# Patient Record
Sex: Female | Born: 1965
Health system: Southern US, Community
[De-identification: ages and names within clinical notes are randomized; demographics above are authoritative.]

## PROBLEM LIST (undated history)

## (undated) DIAGNOSIS — D649 Anemia, unspecified: Secondary | ICD-10-CM

## (undated) HISTORY — DX: Anemia, unspecified: D64.9

---

## 1999-06-27 ENCOUNTER — Other Ambulatory Visit: Admission: RE | Admit: 1999-06-27 | Discharge: 1999-06-27 | Payer: Self-pay | Admitting: *Deleted

## 2000-06-27 ENCOUNTER — Other Ambulatory Visit: Admission: RE | Admit: 2000-06-27 | Discharge: 2000-06-27 | Payer: Self-pay | Admitting: *Deleted

## 2002-06-25 ENCOUNTER — Other Ambulatory Visit: Admission: RE | Admit: 2002-06-25 | Discharge: 2002-06-25 | Payer: Self-pay | Admitting: Obstetrics and Gynecology

## 2002-07-11 ENCOUNTER — Encounter: Payer: Self-pay | Admitting: Obstetrics and Gynecology

## 2002-07-11 ENCOUNTER — Encounter: Admission: RE | Admit: 2002-07-11 | Discharge: 2002-07-11 | Payer: Self-pay | Admitting: Obstetrics and Gynecology

## 2016-06-13 DIAGNOSIS — Z1231 Encounter for screening mammogram for malignant neoplasm of breast: Secondary | ICD-10-CM | POA: Diagnosis not present

## 2016-08-09 DIAGNOSIS — N920 Excessive and frequent menstruation with regular cycle: Secondary | ICD-10-CM | POA: Diagnosis not present

## 2016-08-09 DIAGNOSIS — R635 Abnormal weight gain: Secondary | ICD-10-CM | POA: Diagnosis not present

## 2016-08-09 DIAGNOSIS — Z01419 Encounter for gynecological examination (general) (routine) without abnormal findings: Secondary | ICD-10-CM | POA: Diagnosis not present

## 2016-08-09 DIAGNOSIS — D649 Anemia, unspecified: Secondary | ICD-10-CM

## 2016-08-09 HISTORY — DX: Anemia, unspecified: D64.9

## 2017-02-19 ENCOUNTER — Telehealth: Payer: Self-pay

## 2017-02-19 NOTE — Telephone Encounter (Signed)
02/19/17   Pre Visit call made to patient. Left message with  patients answering service for return call regarding pre-visit information. Patient is deaf.

## 2017-02-20 ENCOUNTER — Encounter: Payer: Self-pay | Admitting: Family

## 2017-02-20 ENCOUNTER — Ambulatory Visit (INDEPENDENT_AMBULATORY_CARE_PROVIDER_SITE_OTHER): Payer: BLUE CROSS/BLUE SHIELD | Admitting: Family

## 2017-02-20 ENCOUNTER — Ambulatory Visit (HOSPITAL_BASED_OUTPATIENT_CLINIC_OR_DEPARTMENT_OTHER)
Admission: RE | Admit: 2017-02-20 | Discharge: 2017-02-20 | Disposition: A | Discharge status: Home / Self Care | Payer: BLUE CROSS/BLUE SHIELD | Source: Ambulatory Visit | Attending: Family | Admitting: Family

## 2017-02-20 VITALS — BP 109/69 | HR 60 | Temp 98.3°F | Resp 16 | Ht 68.0 in | Wt 153.4 lb

## 2017-02-20 DIAGNOSIS — G8929 Other chronic pain: Secondary | ICD-10-CM

## 2017-02-20 DIAGNOSIS — M545 Low back pain, unspecified: Secondary | ICD-10-CM

## 2017-02-20 DIAGNOSIS — M47896 Other spondylosis, lumbar region: Secondary | ICD-10-CM | POA: Diagnosis not present

## 2017-02-20 DIAGNOSIS — R635 Abnormal weight gain: Secondary | ICD-10-CM

## 2017-02-20 DIAGNOSIS — D649 Anemia, unspecified: Secondary | ICD-10-CM

## 2017-02-20 DIAGNOSIS — R8290 Unspecified abnormal findings in urine: Secondary | ICD-10-CM

## 2017-02-20 DIAGNOSIS — L750 Bromhidrosis: Secondary | ICD-10-CM

## 2017-02-20 LAB — CBC WITH DIFFERENTIAL/PLATELET
Basophils Absolute: 0 10*3/uL (ref 0.0–0.1)
Basophils Relative: 0.7 % (ref 0.0–3.0)
Eosinophils Absolute: 0.1 10*3/uL (ref 0.0–0.7)
Eosinophils Relative: 1.2 % (ref 0.0–5.0)
HCT: 38.5 % (ref 36.0–46.0)
Hemoglobin: 12.9 g/dL (ref 12.0–15.0)
Lymphocytes Relative: 27.8 % (ref 12.0–46.0)
Lymphs Abs: 1.6 10*3/uL (ref 0.7–4.0)
MCHC: 33.4 g/dL (ref 30.0–36.0)
MCV: 82.6 fl (ref 78.0–100.0)
Monocytes Absolute: 0.4 10*3/uL (ref 0.1–1.0)
Monocytes Relative: 7.3 % (ref 3.0–12.0)
Neutro Abs: 3.6 10*3/uL (ref 1.4–7.7)
Neutrophils Relative %: 63 % (ref 43.0–77.0)
Platelets: 226 10*3/uL (ref 150.0–400.0)
RBC: 4.66 Mil/uL (ref 3.87–5.11)
RDW: 12.9 % (ref 11.5–15.5)
WBC: 5.7 10*3/uL (ref 4.0–10.5)

## 2017-02-20 LAB — POC URINALSYSI DIPSTICK (AUTOMATED)
Bilirubin, UA: NEGATIVE
Blood, UA: NEGATIVE
Glucose, UA: NEGATIVE
Ketones, UA: NEGATIVE
Leukocytes, UA: NEGATIVE
Nitrite, UA: NEGATIVE
Protein, UA: NEGATIVE
Spec Grav, UA: 1.02 (ref 1.010–1.025)
Urobilinogen, UA: NEGATIVE E.U./dL — AB
pH, UA: 6 (ref 5.0–8.0)

## 2017-02-20 LAB — FERRITIN: Ferritin: 10.5 ng/mL (ref 10.0–291.0)

## 2017-02-20 LAB — IRON: Iron: 90 ug/dL (ref 42–145)

## 2017-02-20 LAB — TSH: TSH: 1.15 u[IU]/mL (ref 0.35–4.50)

## 2017-02-20 NOTE — Patient Instructions (Addendum)
Please complete lab work prior to leaving.  Welcome to [place]! 

## 2017-02-20 NOTE — Addendum Note (Signed)
Addended by: Mervin KungFERGERSON, Jozey Janco A on: 02/20/2017 01:45 PM   Modules accepted: Orders

## 2017-02-20 NOTE — Progress Notes (Signed)
Subjective:    Patient ID: Karla Jackson, female    DOB: 1965-11-02, 51 y.o.   MRN: [id number]  HPI  Karla Jackson is a 51 yr old female who presents today to establish care. She has several concerns:  1) Urinary odor- notes that urine has been cloudy recently with strong odor. Started when she began iron. Denies dysuria or frequency  2) Weight gain- reports that she had been following a high protein diet and gained 25 pounds after stopping her diet.  This has occurred over 4 years. Had gotten down to 130 pounds.  Reports weight has leveled off.   Wt Readings from Last 3 Encounters:  02/20/17 153 lb 6.4 oz (69.6 kg)   3) Right sided low back pain- not debilitating.  Yoga helps.  Non-radiating. No numbness/weakness.  Using advil prn which helps. Declines further work up at this time.   pmhx is significant for anemia. Saw OB/GYN back in November, had some fatigue.  She reports that she had constipation. She follows with [facility]    Review of Systems  Constitutional: Negative for unexpected weight change.  HENT: Negative for hearing loss and rhinorrhea.   Eyes: Negative for visual disturbance.  Respiratory: Negative for cough.   Cardiovascular: Negative for leg swelling.  Gastrointestinal:       Some constipation when on high protein diet  Genitourinary: Negative for dysuria and frequency.  Musculoskeletal: Positive for back pain.  Neurological:       Mild Ha's during menstes  Hematological: Negative for adenopathy.  Psychiatric/Behavioral:       Denies depression/anxiety   Past Medical History:  Diagnosis Date  . Anemia      Social History   Social History  . Marital status: Married    Spouse name: N/A  . Number of children: N/A  . Years of education: N/A   Occupational History  . Not on file.   Social History Main Topics  . Smoking status: Never Smoker  . Smokeless tobacco: Never Used  . Alcohol use No  . Drug use: No  . Sexual activity: Not on file    Other Topics Concern  . Not on file   Social History Narrative   3 sons- [id number] and 11998 (twins)    Married   Works as an Environmental health practitioneradministrative assistant- [employer]   Enjoys yoga, walking the dog, listening to books on audible, mother has alzheimers    Enjoys outdoor activities     Past Surgical History:  Procedure Laterality Date  . CESAREAN SECTION  1998    Family History  Problem Relation Age of Onset  . Alzheimer's disease Mother   . Arthritis Mother   . Heart disease Father   . Hypertension Father   . Stroke Maternal Grandmother     No Known Allergies  No current outpatient prescriptions on file prior to visit.   No current facility-administered medications on file prior to visit.     BP 109/69 (BP Location: Left Arm, Cuff Size: Normal)   Pulse 60   Temp 98.3 F (36.8 C) (Oral)   Resp 16   Ht 5\' 8"  (1.727 m)   Wt 153 lb 6.4 oz (69.6 kg)   LMP 01/26/2017   SpO2 100%   BMI 23.32 kg/m       Objective:   Physical Exam  Constitutional: She is oriented to person, place, and time. She appears well-developed and well-nourished.  HENT:  Head: Normocephalic and atraumatic.  Right Ear:  Tympanic membrane and ear canal normal.  Left Ear: Tympanic membrane and ear canal normal.  Eyes: No scleral icterus.  Neck: Neck supple. No thyromegaly present.  Cardiovascular: Normal rate, regular rhythm and normal heart sounds.   No murmur heard. Pulmonary/Chest: Effort normal and breath sounds normal. No respiratory distress. She has no wheezes.  Musculoskeletal: She exhibits no edema.  Neurological: She is alert and oriented to person, place, and time.  Skin: Skin is warm and dry.  Psychiatric: She has a normal mood and affect. Her behavior is normal. Judgment and thought content normal.          Assessment & Plan:  Weight gain- will check TSH. Discussed that her weight is currently healthy. Recommended that she focus on avoiding further weight gain and  maintaining weight.   Urinary odor- UA unremarkable. Will send urine for culture.   Low back pain- mild, likely musculoskeletal. She would like to image her back, will obtain plain film of lumbar spine.   Anemia- not currently on iron supplement. Check CBC and iron.

## 2017-02-21 ENCOUNTER — Ambulatory Visit: Payer: Self-pay | Admitting: Family

## 2017-02-23 ENCOUNTER — Encounter: Payer: Self-pay | Admitting: Family

## 2017-02-23 ENCOUNTER — Telehealth: Payer: Self-pay | Admitting: Family

## 2017-02-23 LAB — URINE CULTURE

## 2017-02-23 MED ORDER — NITROFURANTOIN MONOHYD MACRO 100 MG PO CAPS: 100.0000 mg | ORAL_CAPSULE | Freq: Two times a day (BID) | ORAL | 0 refills | Status: DC

## 2017-02-23 NOTE — Telephone Encounter (Signed)
Notified pt and she voices understanding. 

## 2017-02-23 NOTE — Telephone Encounter (Signed)
Urine culture is growing bacteria. I would like her to start macrobid. Call if urinary odor does not improve after completion of macrobid.

## 2017-03-19 ENCOUNTER — Encounter: Payer: Self-pay | Admitting: Family

## 2017-03-19 ENCOUNTER — Ambulatory Visit (INDEPENDENT_AMBULATORY_CARE_PROVIDER_SITE_OTHER): Payer: BLUE CROSS/BLUE SHIELD | Admitting: Family

## 2017-03-19 VITALS — BP 102/78 | HR 77 | Temp 98.1°F | Resp 16 | Ht 68.0 in | Wt 149.8 lb

## 2017-03-19 DIAGNOSIS — R9431 Abnormal electrocardiogram [ECG] [EKG]: Secondary | ICD-10-CM

## 2017-03-19 DIAGNOSIS — Z0001 Encounter for general adult medical examination with abnormal findings: Secondary | ICD-10-CM

## 2017-03-19 DIAGNOSIS — Z0000 Encounter for general adult medical examination without abnormal findings: Secondary | ICD-10-CM

## 2017-03-19 DIAGNOSIS — Z23 Encounter for immunization: Secondary | ICD-10-CM

## 2017-03-19 LAB — LIPID PANEL
Cholesterol: 170 mg/dL (ref 0–200)
HDL: 59.8 mg/dL (ref >39.00)
LDL Cholesterol: 96 mg/dL (ref 0–99)
NONHDL: 110.26
TRIGLYCERIDES: 71 mg/dL (ref 0.0–149.0)
Total CHOL/HDL Ratio: 3
VLDL: 14.2 mg/dL (ref 0.0–40.0)

## 2017-03-19 LAB — HEPATIC FUNCTION PANEL
ALK PHOS: 52 U/L (ref 39–117)
ALT: 12 U/L (ref 0–35)
AST: 17 U/L (ref 0–37)
Albumin: 4.4 g/dL (ref 3.5–5.2)
Bilirubin, Direct: 0.1 mg/dL (ref 0.0–0.3)
Total Bilirubin: 0.6 mg/dL (ref 0.2–1.2)
Total Protein: 7.1 g/dL (ref 6.0–8.3)

## 2017-03-19 LAB — BASIC METABOLIC PANEL
BUN: 19 mg/dL (ref 6–23)
CALCIUM: 9.7 mg/dL (ref 8.4–10.5)
CO2: 27 mEq/L (ref 19–32)
CREATININE: 0.91 mg/dL (ref 0.40–1.20)
Chloride: 104 mEq/L (ref 96–112)
GFR: 69.3 mL/min (ref >60.00)
Glucose, Bld: 106 mg/dL — ABNORMAL HIGH (ref 70–99)
Potassium: 4.8 mEq/L (ref 3.5–5.1)
SODIUM: 137 meq/L (ref 135–145)

## 2017-03-19 NOTE — Telephone Encounter (Signed)
Was attempting to add tdap to [facility] and found potential match with pt. Emailed pt to verify past address and last name. Awaiting response.

## 2017-03-19 NOTE — Addendum Note (Signed)
Addended by: Mervin KungFERGERSON, Jermany Rimel A on: 03/19/2017 06:57 PM   Modules accepted: Orders

## 2017-03-19 NOTE — Patient Instructions (Signed)
Please continue healthy diet, regular exercise. Schedule follow up with your GYN for pap smear.  You will be contacted about your referral for exercise stress test.

## 2017-03-19 NOTE — Progress Notes (Signed)
Subjective:    Patient ID: Karla Jackson, female    DOB: 05-08-1966, 51 y.o.   MRN: [id number]  HPI  Patient presents today for complete physical.  Immunizations: Due for tetanus Diet: healthy Exercise: walks/runs/yoga Colonoscopy: due Pap Smear: due, sees GYN Mammogram: 9/18  Wt Readings from Last 3 Encounters:  03/19/17 149 lb 12.8 oz (67.9 kg)  02/20/17 153 lb 6.4 oz (69.6 kg)       Review of Systems  Constitutional: Negative for unexpected weight change.  HENT: Negative for hearing loss and rhinorrhea.   Eyes: Negative for visual disturbance.  Respiratory: Negative for cough.        Reports that she gets "more short of breath than I think I should some times with exercise."  Cardiovascular: Negative for leg swelling.  Gastrointestinal: Negative for constipation and diarrhea.  Genitourinary: Negative for dysuria, frequency and menstrual problem.  Musculoskeletal: Negative for arthralgias and myalgias.  Neurological: Negative for headaches.  Hematological: Negative for adenopathy.  Psychiatric/Behavioral:       Denies depression/anxiety       Past Medical History:  Diagnosis Date  . Anemia      Social History   Social History  . Marital status: Married    Spouse name: N/A  . Number of children: N/A  . Years of education: N/A   Occupational History  . Not on file.   Social History Main Topics  . Smoking status: Never Smoker  . Smokeless tobacco: Never Used  . Alcohol use No  . Drug use: No  . Sexual activity: Not on file   Other Topics Concern  . Not on file   Social History Narrative   3 sons- 95 and 61 (twins)    Married   Works as an Environmental health practitioner- [employer]   Enjoys yoga, walking the dog, listening to books on audible, mother has alzheimers    Enjoys outdoor activities     Past Surgical History:  Procedure Laterality Date  . CESAREAN SECTION  1998    Family History  Problem Relation Age of Onset    . Alzheimer's disease Mother   . Arthritis Mother   . Heart disease Father        Valve replacement surgery, died at 71  . Hypertension Father   . Stroke Maternal Grandmother     No Known Allergies  No current outpatient prescriptions on file prior to visit.   No current facility-administered medications on file prior to visit.     BP 102/78 (BP Location: Right Arm, Cuff Size: Normal)   Pulse 77   Temp 98.1 F (36.7 C) (Oral)   Resp 16   Ht 5\' 8"  (1.727 m)   Wt 149 lb 12.8 oz (67.9 kg)   LMP 02/28/2017   SpO2 99%   BMI 22.78 kg/m    Objective:   Physical Exam  Physical Exam  Constitutional: She is oriented to person, place, and time. She appears well-developed and well-nourished. No distress.  HENT:  Head: Normocephalic and atraumatic.  Right Ear: Tympanic membrane and ear canal normal.  Left Ear: Tympanic membrane and ear canal normal.  Mouth/Throat: Oropharynx is clear and moist.  Eyes: Pupils are equal, round, and reactive to light. No scleral icterus.  Neck: Normal range of motion. No thyromegaly present.  Cardiovascular: Normal rate and regular rhythm.   No murmur heard. Pulmonary/Chest: Effort normal and breath sounds normal. No respiratory distress. He has no wheezes. She has no rales. She  exhibits no tenderness.  Abdominal: Soft. Bowel sounds are normal. She exhibits no distension and no mass. There is no tenderness. There is no rebound and no guarding.  Musculoskeletal: She exhibits no edema.  Lymphadenopathy:    She has no cervical adenopathy.  Neurological: She is alert and oriented to person, place, and time. She has normal patellar reflexes. She exhibits normal muscle tone. Coordination normal.  Skin: Skin is warm and dry.  Psychiatric: She has a normal mood and affect. Her behavior is normal. Judgment and thought content normal.  Breasts: Examined lying Right: Without masses, retractions, discharge or axillary adenopathy.  Left: Without masses,  retractions, discharge or axillary adenopathy.  Pelvic: deferred       Assessment & Plan:         Assessment & Plan:  Preventative care- discussed continuing healthy diet, exercise. Tdap today. Obtain routine lab work. Advised pt to arrange follow up with her GYN for pap. Mammo up to date. Refer for colo. I have also asked her to check with her insurance re: coverage for shingrix then schedule nurse visit.   Abnormal EKG- note is made of TWI in V2. She reports that she has seen cardiology in the past. Had EKG with Dr. Thomes LollingFulk, cardiology. Will request these records and will also refer for an ETT due to report of feeling short of breath at times with exercise.

## 2017-03-20 ENCOUNTER — Encounter: Payer: BLUE CROSS/BLUE SHIELD | Admitting: Family

## 2017-03-20 NOTE — Telephone Encounter (Signed)
[facility] updated. 

## 2017-03-28 ENCOUNTER — Telehealth: Payer: Self-pay | Admitting: *Deleted

## 2017-03-29 NOTE — Telephone Encounter (Signed)
Pt signed records release to [facility] at last [facility] on 03/19/17. Request faxed to [phone]. Awaiting records.

## 2017-04-03 ENCOUNTER — Encounter: Payer: Self-pay | Admitting: *Deleted

## 2017-04-03 ENCOUNTER — Encounter: Payer: Self-pay | Admitting: Gastroenterology

## 2017-04-03 NOTE — Telephone Encounter (Signed)
Scheduled stress test for 04/09/17 at 10am. Pt to arrive 10 min early and wear comfortable / workout clothes. [address] address and phone # given to pt. Records release faxed to below #.

## 2017-04-03 NOTE — Telephone Encounter (Signed)
I did not refer to cardiology but did place a referral for an Exercise stress test. Can  You please check status of scheduling of the stress test?

## 2017-04-03 NOTE — Telephone Encounter (Signed)
Received fax from Dr Timoteo GaulFulk's office that they are not able to find records for this patient in their system. Attempted to reach pt and verify if request was sent to correct doctor / location and left detailed message to call back and confirm cardiology contact.

## 2017-04-03 NOTE — Telephone Encounter (Signed)
Pt called me back stating her records stayed with [facility] and we can fax records release to Attn: Consuella LoseElaine at [phone]. Pt also states she has not been contacted by cardiology for an appt yet. Were we waiting to get previous records before placing referral?

## 2017-04-05 ENCOUNTER — Telehealth: Payer: Self-pay | Admitting: *Deleted

## 2017-04-05 NOTE — Telephone Encounter (Signed)
Received [facility], Dr. Thomes L[facility]lingFulk, forwarded to provider/SLS 06/28

## 2017-04-10 ENCOUNTER — Ambulatory Visit (INDEPENDENT_AMBULATORY_CARE_PROVIDER_SITE_OTHER): Payer: BLUE CROSS/BLUE SHIELD

## 2017-04-10 ENCOUNTER — Ambulatory Visit (AMBULATORY_SURGERY_CENTER): Payer: Self-pay

## 2017-04-10 VITALS — Ht 68.0 in | Wt 152.0 lb

## 2017-04-10 DIAGNOSIS — R9431 Abnormal electrocardiogram [ECG] [EKG]: Secondary | ICD-10-CM | POA: Diagnosis not present

## 2017-04-10 DIAGNOSIS — Z1211 Encounter for screening for malignant neoplasm of colon: Secondary | ICD-10-CM

## 2017-04-10 LAB — EXERCISE TOLERANCE TEST
CHL CUP RESTING HR STRESS: 62 {beats}/min
CHL RATE OF PERCEIVED EXERTION: 15
CSEPEDS: 30 s
CSEPEW: 12.8 METS
Exercise duration (min): 10 min
MPHR: 170 {beats}/min
Peak HR: 151 {beats}/min
Percent HR: 88 %

## 2017-04-10 MED ORDER — NA SULFATE-K SULFATE-MG SULF 17.5-3.13-1.6 GM/177ML PO SOLN: ORAL | 0 refills | Status: DC

## 2017-04-10 NOTE — Progress Notes (Signed)
Per pt, no allergies to soy or egg products.Pt not taking any weight loss meds or using  O2 at home.  Emmi video sent to pt's email. 

## 2017-04-12 ENCOUNTER — Encounter: Payer: Self-pay | Admitting: Family

## 2017-04-13 ENCOUNTER — Encounter: Payer: Self-pay | Admitting: Gastroenterology

## 2017-04-20 ENCOUNTER — Encounter: Payer: Self-pay | Admitting: Gastroenterology

## 2017-04-20 ENCOUNTER — Ambulatory Visit (AMBULATORY_SURGERY_CENTER): Payer: BLUE CROSS/BLUE SHIELD | Admitting: Gastroenterology

## 2017-04-20 VITALS — BP 108/73 | HR 69 | Temp 98.7°F | Resp 11 | Ht 68.0 in | Wt 152.0 lb

## 2017-04-20 DIAGNOSIS — Z1211 Encounter for screening for malignant neoplasm of colon: Secondary | ICD-10-CM

## 2017-04-20 DIAGNOSIS — Z1212 Encounter for screening for malignant neoplasm of rectum: Secondary | ICD-10-CM

## 2017-04-20 MED ORDER — SODIUM CHLORIDE 0.9 % IV SOLN: 500.0000 mL | INTRAVENOUS | Status: AC

## 2017-04-20 NOTE — Op Note (Signed)
[facility] Patient Name: Karla Jackson Procedure Date: 04/20/2017 2:24 PM MRN: [id number] Endoscopist: Napoleon Form , MD Age: 51 Referring MD:  Date of Birth: 08-27-1966 Gender: Female Account #: [id number] Procedure:                Colonoscopy Indications:              Screening for colorectal malignant neoplasm Medicines:                Monitored Anesthesia Care Procedure:                Pre-Anesthesia Assessment:                           - Prior to the procedure, a History and Physical                            was performed, and patient medications and                            allergies were reviewed. The patient's tolerance of                            previous anesthesia was also reviewed. The risks                            and benefits of the procedure and the sedation                            options and risks were discussed with the patient.                            All questions were answered, and informed consent                            was obtained. Prior Anticoagulants: The patient has                            taken no previous anticoagulant or antiplatelet                            agents. ASA Grade Assessment: II - A patient with                            mild systemic disease. After reviewing the risks                            and benefits, the patient was deemed in                            satisfactory condition to undergo the procedure.                           After obtaining informed consent, the colonoscope  was passed under direct vision. Throughout the                            procedure, the patient's blood pressure, pulse, and                            oxygen saturations were monitored continuously. The                            Model PCF-[id number]) scope was introduced                            through the anus and advanced to the the terminal                            ileum,  with identification of the appendiceal                            orifice and IC valve. The colonoscopy was performed                            without difficulty. The patient tolerated the                            procedure well. The quality of the bowel                            preparation was excellent. The terminal ileum,                            ileocecal valve, appendiceal orifice, and rectum                            were photographed. Scope In: 2:34:47 PM Scope Out: 2:49:13 PM Scope Withdrawal Time: 0 hours 7 minutes 25 seconds  Total Procedure Duration: 0 hours 14 minutes 26 seconds  Findings:                 The perianal and digital rectal examinations were                            normal.                           Non-bleeding internal hemorrhoids were found during                            retroflexion. The hemorrhoids were small.                           The exam was otherwise without abnormality. Complications:            No immediate complications. Estimated Blood Loss:     Estimated blood loss: none. Impression:               - Non-bleeding internal hemorrhoids.                           -  The examination was otherwise normal.                           - No specimens collected. Recommendation:           - Patient has a contact number available for                            emergencies. The signs and symptoms of potential                            delayed complications were discussed with the                            patient. Return to normal activities tomorrow.                            Written discharge instructions were provided to the                            patient.                           - Resume previous diet.                           - Continue present medications.                           - Await pathology results.                           - Repeat colonoscopy in 10 years for screening                            purposes. Napoleon Form, MD 04/20/2017 2:53:42 PM This report has been signed electronically.

## 2017-04-20 NOTE — Progress Notes (Signed)
Alert and oriented x3, pleased with MAC, report to RN Judson Roch

## 2017-04-20 NOTE — Patient Instructions (Signed)
YOU HAD AN ENDOSCOPIC PROCEDURE TODAY AT THE [facility]:   Refer to the procedure report that was given to you for any specific questions about what was found during the examination.  If the procedure report does not answer your questions, please call your gastroenterologist to clarify.  If you requested that your care partner not be given the details of your procedure findings, then the procedure report has been included in a sealed envelope for you to review at your convenience later.  YOU SHOULD EXPECT: Some feelings of bloating in the abdomen. Passage of more gas than usual.  Walking can help get rid of the air that was put into your GI tract during the procedure and reduce the bloating. If you had a lower endoscopy (such as a colonoscopy or flexible sigmoidoscopy) you may notice spotting of blood in your stool or on the toilet paper. If you underwent a bowel prep for your procedure, you may not have a normal bowel movement for a few days.  Please Note:  You might notice some irritation and congestion in your nose or some drainage.  This is from the oxygen used during your procedure.  There is no need for concern and it should clear up in a day or so.  SYMPTOMS TO REPORT IMMEDIATELY:   Following lower endoscopy (colonoscopy or flexible sigmoidoscopy):  Excessive amounts of blood in the stool  Significant tenderness or worsening of abdominal pains  Swelling of the abdomen that is new, acute  Fever of 100F or higher  For urgent or emergent issues, a gastroenterologist can be reached at any hour by calling [phone].   DIET:  We do recommend a small meal at first, but then you may proceed to your regular diet.  Drink plenty of fluids but you should avoid alcoholic beverages for 24 hours.  MEDICATIONS:  Continue present medications.  ACTIVITY:  You should plan to take it easy for the rest of today and you should NOT DRIVE or use heavy machinery until tomorrow (because of the  sedation medicines used during the test).    FOLLOW UP: Our staff will call the number listed on your records the next business day following your procedure to check on you and address any questions or concerns that you may have regarding the information given to you following your procedure. If we do not reach you, we will leave a message.  However, if you are feeling well and you are not experiencing any problems, there is no need to return our call.  We will assume that you have returned to your regular daily activities without incident.  If any biopsies were taken you will be contacted by phone or by letter within the next 1-3 weeks.  Please call us at [phone] if you have not heard about the biopsies in 3 weeks.   Thank you for allowing us to provide for your healthcare needs today.   SIGNATURES/CONFIDENTIALITY: You and/or your care partner have signed paperwork which will be entered into your electronic medical record.  These signatures attest to the fact that that the information above on your After Visit Summary has been reviewed and is understood.  Full responsibility of the confidentiality of this discharge information lies with you and/or your care-partner. 

## 2017-04-20 NOTE — Progress Notes (Signed)
Pt's states no medical or surgical changes since previsit or office visit. 

## 2017-04-23 ENCOUNTER — Telehealth: Payer: Self-pay | Admitting: *Deleted

## 2017-04-23 ENCOUNTER — Telehealth: Payer: Self-pay

## 2017-04-23 NOTE — Telephone Encounter (Signed)
No answer. Message left to call if questions or concerns. 

## 2017-04-23 NOTE — Telephone Encounter (Signed)
  Follow up Call-  Call back number 04/20/2017  Post procedure Call Back phone  # [phone]  Permission to leave phone message Yes  Some recent data might be hidden     No answer, left message

## 2017-06-22 DIAGNOSIS — M546 Pain in thoracic spine: Secondary | ICD-10-CM | POA: Diagnosis not present

## 2017-06-26 DIAGNOSIS — S46811D Strain of other muscles, fascia and tendons at shoulder and upper arm level, right arm, subsequent encounter: Secondary | ICD-10-CM | POA: Diagnosis not present

## 2017-07-13 DIAGNOSIS — Z1231 Encounter for screening mammogram for malignant neoplasm of breast: Secondary | ICD-10-CM | POA: Diagnosis not present

## 2017-07-25 ENCOUNTER — Encounter: Payer: Self-pay | Admitting: Family

## 2017-07-25 ENCOUNTER — Ambulatory Visit (INDEPENDENT_AMBULATORY_CARE_PROVIDER_SITE_OTHER): Payer: BLUE CROSS/BLUE SHIELD | Admitting: Family

## 2017-07-25 VITALS — BP 120/73 | HR 66 | Temp 97.9°F | Resp 18 | Ht 68.0 in | Wt 154.0 lb

## 2017-07-25 DIAGNOSIS — M5412 Radiculopathy, cervical region: Secondary | ICD-10-CM | POA: Diagnosis not present

## 2017-07-25 DIAGNOSIS — B354 Tinea corporis: Secondary | ICD-10-CM | POA: Diagnosis not present

## 2017-07-25 MED ORDER — MELOXICAM 7.5 MG PO TABS: 7.5000 mg | ORAL_TABLET | Freq: Every day | ORAL | 0 refills | Status: DC

## 2017-07-25 NOTE — Progress Notes (Signed)
Subjective:    Patient ID: Karla Jackson, female    DOB: May 17, 1966, 51 y.o.   MRN: [id number]  HPI  Karla Jackson is a 51 yr old female who presents today with chief complaint of back pain. Reports that back pain is located in the upper back. Also has some right shoulder pain.  Pain started around 10/1 and she was seen by urgent care. They treated her with advil and muscle relaxers.    9/8- had migraine. Had 3 "fever blisters" inside her lower lip.  She developed back pain the following Tuesday.  Friday she had severe pain between her scapula and which radiated down the right arm.  She went to a walk in clinic.  PA thought that her pain was a muscle spasm. Was given IM NSAID and muscle relaxer.  Felt some better the next day. Then on Monday she went to work and pain was terrible.  Returned Tuesday AM and was given a steroid injection, oral taper, muscle relaxer.  Went to the [facility].  She had a massage on Thursday. She reports numbness in the right index finger. Sitting worsens pain.   Skin problem- reports white spots on arms/upper abdomen.  Review of Systems See HPI  Past Medical History:  Diagnosis Date  . Anemia 08/2016     Social History   Social History  . Marital status: Married    Spouse name: N/A  . Number of children: N/A  . Years of education: N/A   Occupational History  . Not on file.   Social History Main Topics  . Smoking status: Never Smoker  . Smokeless tobacco: Never Used  . Alcohol use No  . Drug use: No  . Sexual activity: Not on file   Other Topics Concern  . Not on file   Social History Narrative   3 sons- 42 and 25 (twins)    Married   Works as an Environmental health practitioner- [employer]   Enjoys yoga, walking the dog, listening to books on audible, mother has alzheimers    Enjoys outdoor activities     Past Surgical History:  Procedure Laterality Date  . CESAREAN SECTION  1998   1 time    Family History  Problem  Relation Age of Onset  . Alzheimer's disease Mother   . Arthritis Mother   . Scoliosis Mother   . Heart disease Father        Valve replacement surgery, died at 21  . Hypertension Father   . Stroke Maternal Grandmother   . Scoliosis Sister   . Colon cancer Neg Hx   . Esophageal cancer Neg Hx   . Stomach cancer Neg Hx     Allergies  Allergen Reactions  . Ampicillin     rash  . Erythromycin     rash    Current Outpatient Prescriptions on File Prior to Visit  Medication Sig Dispense Refill  . acetaminophen (TYLENOL) 500 MG tablet Take 500 mg by mouth every 6 (six) hours as needed.    Marland Kitchen ibuprofen (ADVIL,MOTRIN) 200 MG tablet Take 200 mg by mouth every 6 (six) hours as needed.     Current Facility-Administered Medications on File Prior to Visit  Medication Dose Route Frequency Provider Last Rate Last Dose  . 0.9 %  sodium chloride infusion  500 mL Intravenous Continuous Nandigam, Kavitha V, MD        BP 120/73 (BP Location: Left Arm, Cuff Size: Normal)   Pulse 66  Temp 97.9 F (36.6 C) (Oral)   Resp 18   Ht 5\' 8"  (1.727 m)   Wt 154 lb (69.9 kg)   LMP 07/13/2017   SpO2 99%   BMI 23.42 kg/m       Objective:   Physical Exam  Constitutional: She is oriented to person, place, and time. She appears well-developed and well-nourished.  Cardiovascular: Normal rate, regular rhythm and normal heart sounds.   No murmur heard. Pulmonary/Chest: Effort normal and breath sounds normal. No respiratory distress. She has no wheezes.  Musculoskeletal: She exhibits no edema.  Neurological: She is alert and oriented to person, place, and time.  Bilateral upper extremity strength is 5 out of 5  Skin: Skin is warm and dry.  She has some raised annular lesions noted on her chest. Shoulders are noted to have hypopigmentation most consistent with sun damage  Psychiatric: She has a normal mood and affect. Her behavior is normal. Judgment and thought content normal.          Assessment  & Plan:  Cervical pain with radiculopathy-patient with radiculopathy in C7 distribution on the right.  She has failed treatment with steroids as well as  anti-inflammatories. Conservative measures. In the meantime I've given her a prescription for meloxicam and as needed Flexeril.  Tinea corporis-will give trial of otc lotrimin cream.  Apply bid and call if rash worsens or fails to improve in next few weeks.

## 2017-07-25 NOTE — Patient Instructions (Addendum)
Begin lotrimin cream twice daily to rash for next 2 weeks or until resolved. Will order MRI of the cervical spine to further evaluate given duration and lack of response to Call if symptoms worsen or fail to improve. For neck/arm pain- please begin meloxicam once daily.  You may use flexeril as needed but this may cause drowsiness so don't drive after taking. You will be contacted about scheduling your MRI once we get approval from your insurance.

## 2017-08-03 ENCOUNTER — Encounter: Payer: Self-pay | Admitting: Family

## 2017-08-03 ENCOUNTER — Other Ambulatory Visit: Payer: Self-pay | Admitting: Family

## 2017-08-03 MED ORDER — FLUCONAZOLE 150 MG PO TABS: ORAL_TABLET | ORAL | 0 refills | Status: DC

## 2017-08-03 NOTE — Telephone Encounter (Signed)
Could you [facility] at [facility]?

## 2017-08-03 NOTE — Progress Notes (Signed)
di

## 2017-08-13 DIAGNOSIS — M50223 Other cervical disc displacement at C6-C7 level: Secondary | ICD-10-CM | POA: Diagnosis not present

## 2017-08-13 DIAGNOSIS — M4802 Spinal stenosis, cervical region: Secondary | ICD-10-CM | POA: Diagnosis not present

## 2017-08-22 ENCOUNTER — Encounter: Payer: Self-pay | Admitting: Family

## 2017-08-22 NOTE — Telephone Encounter (Signed)
Could you please contact [facility] and request MRI C spine report?

## 2017-08-24 NOTE — Telephone Encounter (Signed)
Spoke with Karla BradfordKimberly at [facility], [phone] and requested report be faxed to nurse station. Awaiting result.

## 2017-08-24 NOTE — Telephone Encounter (Signed)
Results received and placed in PCP yellow folder for review. Please advise.

## 2017-08-26 NOTE — Progress Notes (Deleted)
Subjective:    Patient ID: Karla Jackson, female    DOB: 1966/09/04, 51 y.o.   MRN: [id number]  HPI   Pt is a 51 yr old female who presents today for follow up.   Tinea corporis- last visit we rx'd with lotrimin cream.  This did not help her rash. We then rx'd oral diflucan.   Cervical neck pain with radiculopathy- pt was treated with flexeril and meloxicam. She completed an MRI.  MRI noted 3 mm C5-6 disc protrusionwhich deforms the ventral spinal cortd resulting in mild canal stenosis.  Degenerative changes of the cpain noted with severe Right C3-4 facet arthropathy and mild left C6-7 neural foraminal narrowing.     Review of Systems See HPI  Past Medical History:  Diagnosis Date  . Anemia 08/2016     Social History   Socioeconomic History  . Marital status: Married    Spouse name: Not on file  . Number of children: Not on file  . Years of education: Not on file  . Highest education level: Not on file  Social Needs  . Financial resource strain: Not on file  . Food insecurity - worry: Not on file  . Food insecurity - inability: Not on file  . Transportation needs - medical: Not on file  . Transportation needs - non-medical: Not on file  Occupational History  . Not on file  Tobacco Use  . Smoking status: Never Smoker  . Smokeless tobacco: Never Used  Substance and Sexual Activity  . Alcohol use: No  . Drug use: No  . Sexual activity: Not on file  Other Topics Concern  . Not on file  Social History Narrative   3 sons- [id number] and [id number] (twins)    Married   Works as an Environmental health practitioneradministrative assistant- [employer]   Enjoys yoga, walking the dog, listening to books on audible, mother has alzheimers    Enjoys outdoor activities     Past Surgical History:  Procedure Laterality Date  . CESAREAN SECTION  1998   1 time    Family History  Problem Relation Age of Onset  . Alzheimer's disease Mother   . Arthritis Mother   . Scoliosis Mother   . Heart  disease Father        Valve replacement surgery, died at 5185  . Hypertension Father   . Stroke Maternal Grandmother   . Scoliosis Sister   . Colon cancer Neg Hx   . Esophageal cancer Neg Hx   . Stomach cancer Neg Hx     Allergies  Allergen Reactions  . Ampicillin     rash  . Erythromycin     rash    Current Outpatient Medications on File Prior to Visit  Medication Sig Dispense Refill  . acetaminophen (TYLENOL) 500 MG tablet Take 500 mg by mouth every 6 (six) hours as needed.    . cyclobenzaprine (FLEXERIL) 10 MG tablet Take 1 tablet by mouth 2 (two) times daily.  0  . fluconazole (DIFLUCAN) 150 MG tablet Take 1 tablet by mouth today, repeat in 1 week. 2 tablet 0  . meloxicam (MOBIC) 7.5 MG tablet Take 1 tablet (7.5 mg total) by mouth daily. 30 tablet 0   Current Facility-Administered Medications on File Prior to Visit  Medication Dose Route Frequency Provider Last Rate Last Dose  . 0.9 %  sodium chloride infusion  500 mL Intravenous Continuous Nandigam, Eleonore ChiquitoKavitha V, MD        There were no  vitals taken for this visit.      Objective:   Physical Exam        Assessment & Plan:

## 2017-08-27 ENCOUNTER — Ambulatory Visit: Payer: BLUE CROSS/BLUE SHIELD | Admitting: Family

## 2017-08-28 ENCOUNTER — Encounter: Payer: Self-pay | Admitting: Family

## 2017-08-28 ENCOUNTER — Ambulatory Visit: Payer: BLUE CROSS/BLUE SHIELD | Admitting: Family

## 2017-08-28 VITALS — BP 121/76 | HR 55 | Temp 97.9°F | Resp 16 | Ht 68.0 in | Wt 153.4 lb

## 2017-08-28 DIAGNOSIS — M5020 Other cervical disc displacement, unspecified cervical region: Secondary | ICD-10-CM | POA: Diagnosis not present

## 2017-08-28 DIAGNOSIS — M5030 Other cervical disc degeneration, unspecified cervical region: Secondary | ICD-10-CM

## 2017-08-28 NOTE — Patient Instructions (Signed)
Please let me know your preference for neurosurgery asap. Go to the ER if you develop numbness/weakness in your legs or if you develop bowel/bladder incontinence.

## 2017-08-28 NOTE — Progress Notes (Signed)
Subjective:    Patient ID: Karla PoissonJacqueline L Nichols, female    DOB: Jul 28, 1966, 51 y.o.   MRN: [id number]  HPI  Patient is a 51 yr old female who presents today for follow up of her neck and radicular arm pain.  She completed an MRI of the cervical spine on August 13, 2017.  MRI was significant for 3 mm C5-6 disc protrusion which deforms the ventral spinal cord resulting in mild canal stenosis.  Note was also made of severe right C3-4 facet arthropathy.  In addition she had mild left C6-7 neural foraminal narrowing.  This MRI was performed at [facility].  She continues to have some pain radiating down the right arm.  She denies bowel or bladder incontinence.  Review of Systems    see HPI  Past Medical History:  Diagnosis Date  . Anemia 08/2016     Social History   Socioeconomic History  . Marital status: Married    Spouse name: Not on file  . Number of children: Not on file  . Years of education: Not on file  . Highest education level: Not on file  Social Needs  . Financial resource strain: Not on file  . Food insecurity - worry: Not on file  . Food insecurity - inability: Not on file  . Transportation needs - medical: Not on file  . Transportation needs - non-medical: Not on file  Occupational History  . Not on file  Tobacco Use  . Smoking status: Never Smoker  . Smokeless tobacco: Never Used  Substance and Sexual Activity  . Alcohol use: No  . Drug use: No  . Sexual activity: Not on file  Other Topics Concern  . Not on file  Social History Narrative   3 sons- [id number] and [id number] (twins)    Married   Works as an Environmental health practitioneradministrative assistant- [employer]   Enjoys yoga, walking the dog, listening to books on audible, mother has alzheimers    Enjoys outdoor activities     Past Surgical History:  Procedure Laterality Date  . CESAREAN SECTION  1998   1 time    Family History  Problem Relation Age of Onset  . Alzheimer's disease Mother   . Arthritis  Mother   . Scoliosis Mother   . Heart disease Father        Valve replacement surgery, died at 6885  . Hypertension Father   . Stroke Maternal Grandmother   . Scoliosis Sister   . Colon cancer Neg Hx   . Esophageal cancer Neg Hx   . Stomach cancer Neg Hx     Allergies  Allergen Reactions  . Ampicillin     rash  . Erythromycin     rash    Current Outpatient Medications on File Prior to Visit  Medication Sig Dispense Refill  . acetaminophen (TYLENOL) 500 MG tablet Take 500 mg by mouth every 6 (six) hours as needed.    . cyclobenzaprine (FLEXERIL) 10 MG tablet Take 1 tablet by mouth 2 (two) times daily.  0  . fluconazole (DIFLUCAN) 150 MG tablet Take 1 tablet by mouth today, repeat in 1 week. 2 tablet 0  . meloxicam (MOBIC) 7.5 MG tablet Take 1 tablet (7.5 mg total) by mouth daily. 30 tablet 0   Current Facility-Administered Medications on File Prior to Visit  Medication Dose Route Frequency Provider Last Rate Last Dose  . 0.9 %  sodium chloride infusion  500 mL Intravenous Continuous Nandigam, Eleonore ChiquitoKavitha V, MD  BP 121/76 (BP Location: Right Arm, Patient Position: Sitting, Cuff Size: Small)   Pulse (!) 55   Temp 97.9 F (36.6 C) (Oral)   Resp 16   Ht 5\' 8"  (1.727 m)   Wt 153 lb 6.4 oz (69.6 kg)   LMP 08/23/2017   SpO2 100%   BMI 23.32 kg/m    Objective:   Physical Exam  Constitutional: She is oriented to person, place, and time. She appears well-developed and well-nourished. No distress.  Neurological: She is alert and oriented to person, place, and time.  Psychiatric: She has a normal mood and affect. Her behavior is normal. Judgment and thought content normal.          Assessment & Plan:  Degenerative disc disease of the cervical spine/disc herniation-advised patient that I am concerned about the ventral spinal cord compression.   we discussed red flags that should prompt her to go to the emergency department such as numbness, weakness, bowel or bladder  incontinence.  I have advised her that he will be best to have her see neurosurgery.  I suggested [facility].  She wishes to do some research prior to scheduling appointment.  I have advised her let me know as soon as possible and I will arrange referral.

## 2017-09-09 ENCOUNTER — Encounter: Payer: Self-pay | Admitting: Family

## 2017-09-09 DIAGNOSIS — G9520 Unspecified cord compression: Secondary | ICD-10-CM

## 2017-09-12 DIAGNOSIS — Z1151 Encounter for screening for human papillomavirus (HPV): Secondary | ICD-10-CM | POA: Diagnosis not present

## 2017-09-12 DIAGNOSIS — Z01419 Encounter for gynecological examination (general) (routine) without abnormal findings: Secondary | ICD-10-CM | POA: Diagnosis not present

## 2017-09-24 DIAGNOSIS — L818 Other specified disorders of pigmentation: Secondary | ICD-10-CM | POA: Diagnosis not present

## 2018-01-13 DIAGNOSIS — Z881 Allergy status to other antibiotic agents status: Secondary | ICD-10-CM | POA: Diagnosis not present

## 2018-01-13 DIAGNOSIS — R630 Anorexia: Secondary | ICD-10-CM | POA: Diagnosis not present

## 2018-01-13 DIAGNOSIS — K5900 Constipation, unspecified: Secondary | ICD-10-CM | POA: Diagnosis not present

## 2018-01-13 DIAGNOSIS — Z880 Allergy status to penicillin: Secondary | ICD-10-CM | POA: Diagnosis not present

## 2018-01-13 DIAGNOSIS — R1084 Generalized abdominal pain: Secondary | ICD-10-CM | POA: Diagnosis not present

## 2018-01-13 DIAGNOSIS — R109 Unspecified abdominal pain: Secondary | ICD-10-CM | POA: Diagnosis not present

## 2018-01-13 DIAGNOSIS — K429 Umbilical hernia without obstruction or gangrene: Secondary | ICD-10-CM | POA: Diagnosis not present

## 2018-01-13 DIAGNOSIS — R110 Nausea: Secondary | ICD-10-CM | POA: Diagnosis not present

## 2018-07-23 DIAGNOSIS — Z1239 Encounter for other screening for malignant neoplasm of breast: Secondary | ICD-10-CM | POA: Diagnosis not present

## 2018-07-23 DIAGNOSIS — Z1231 Encounter for screening mammogram for malignant neoplasm of breast: Secondary | ICD-10-CM | POA: Diagnosis not present

## 2018-08-05 DIAGNOSIS — R928 Other abnormal and inconclusive findings on diagnostic imaging of breast: Secondary | ICD-10-CM | POA: Diagnosis not present

## 2018-08-05 LAB — HM MAMMOGRAPHY

## 2018-08-11 IMAGING — DX DG LUMBAR SPINE COMPLETE 4+V
5 series · 5 of 5 positions shown · non-contrast
Comparison: None.

CLINICAL DATA: Right-sided back pain

EXAM:
LUMBAR SPINE - COMPLETE 4+ VIEW

[l-spine ap]
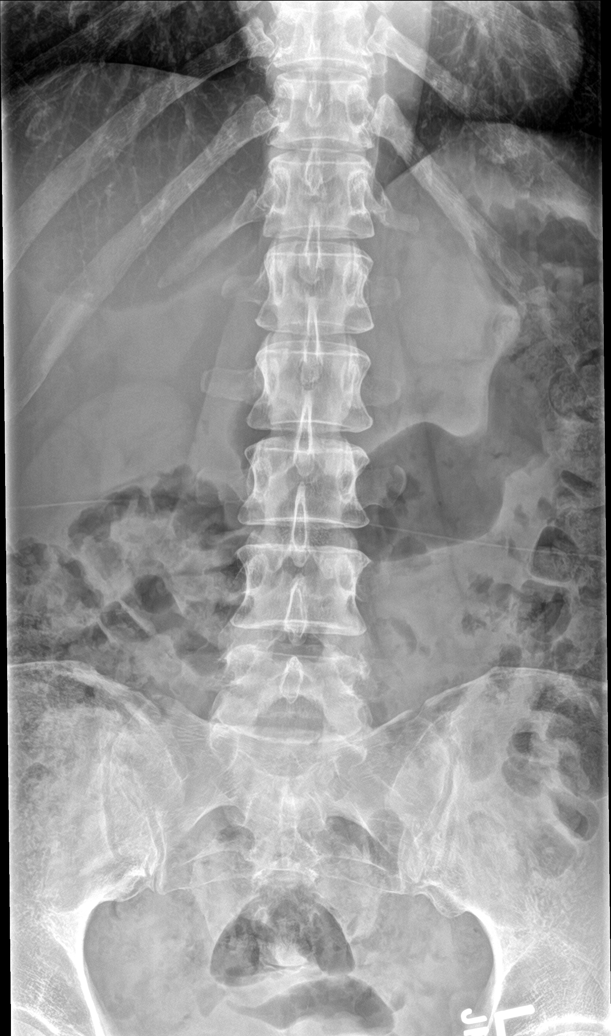

[l-spine obl (1 of 2)]
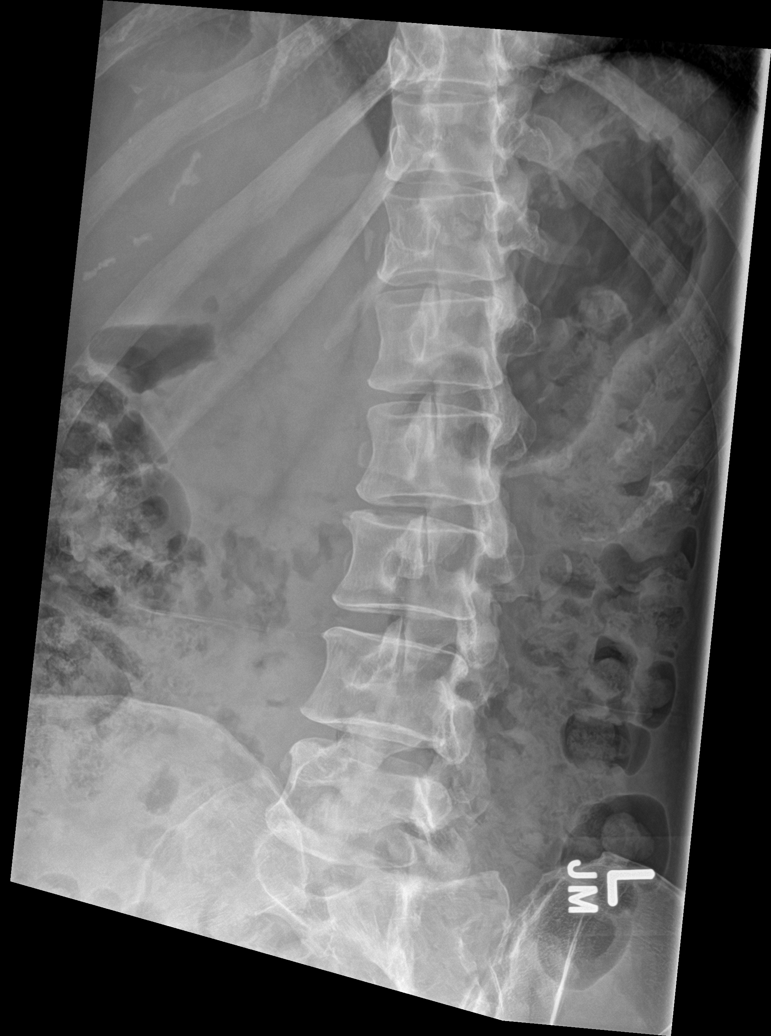

[l-spine obl (2 of 2)]
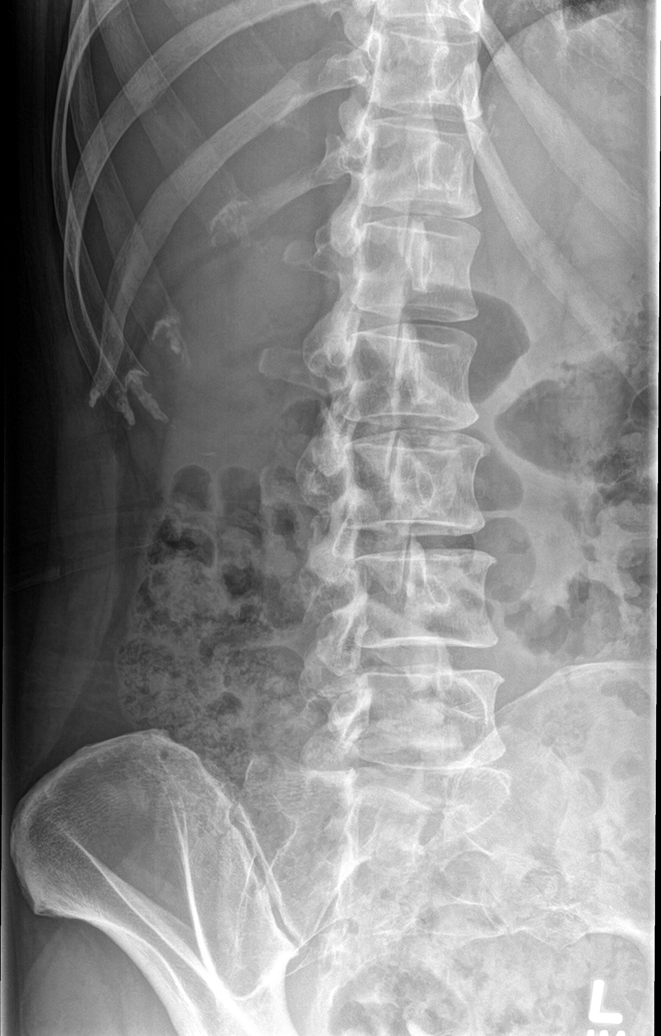

[l-spine lat]
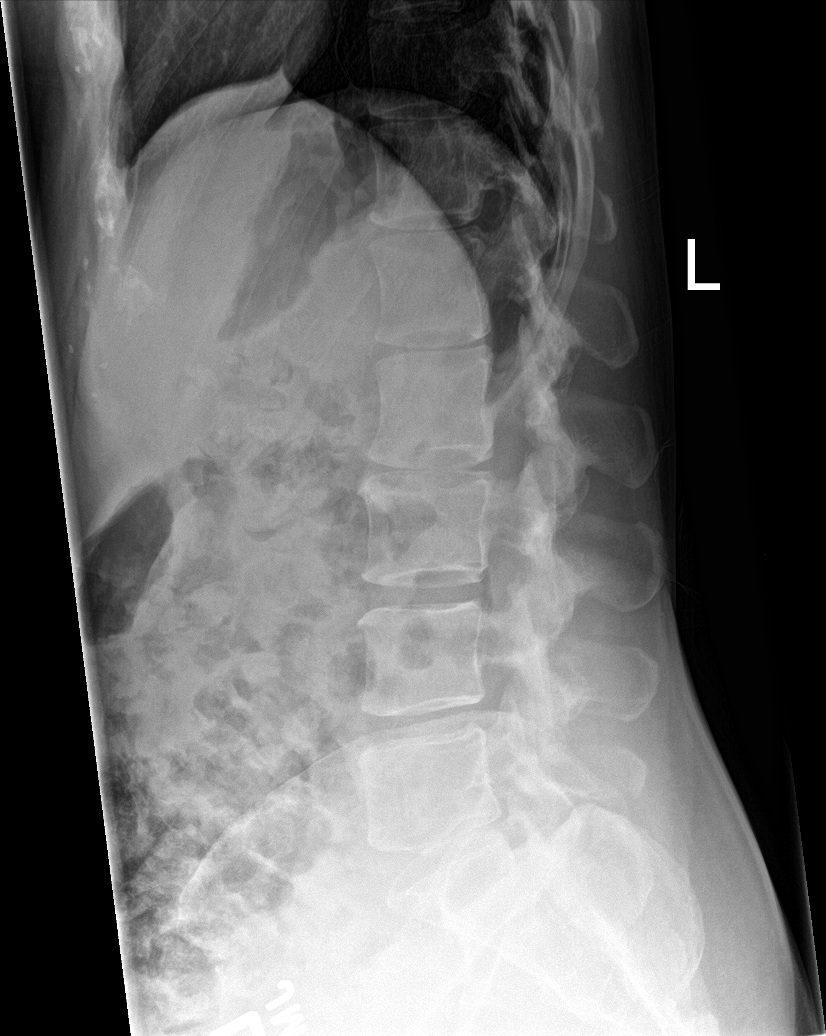

[l-spine spot]
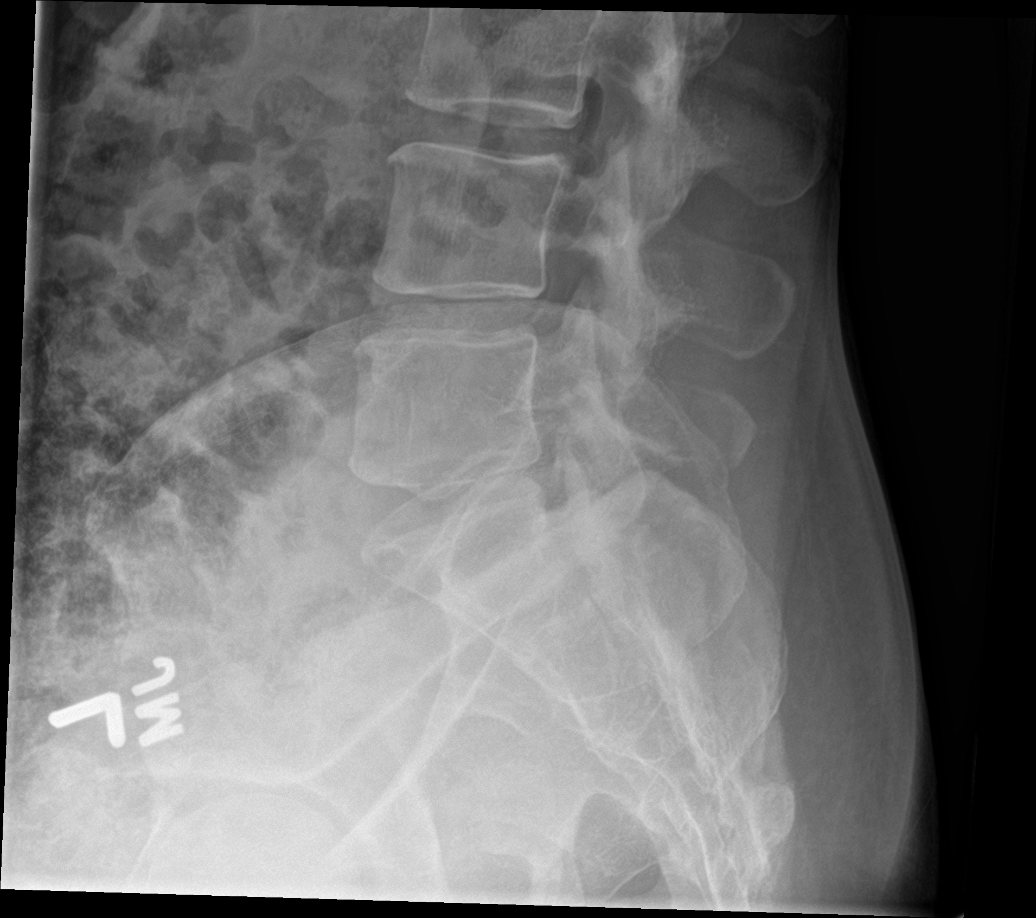

[5 of 5 positions shown; findings below may reference images not displayed]

FINDINGS: Five lumbar type vertebral bodies are well visualized. Vertebral
body height is well maintained. No pars defects are seen. No
anterolisthesis is noted. Very minimal osteophytic changes are seen.
No soft tissue abnormality is noted.
IMPRESSION: Mild degenerative change without acute abnormality.

## 2018-09-23 ENCOUNTER — Ambulatory Visit (INDEPENDENT_AMBULATORY_CARE_PROVIDER_SITE_OTHER): Payer: BLUE CROSS/BLUE SHIELD | Admitting: Family

## 2018-09-23 ENCOUNTER — Encounter: Payer: Self-pay | Admitting: Family

## 2018-09-23 VITALS — BP 121/66 | HR 63 | Temp 98.3°F | Resp 16 | Ht 68.0 in | Wt 155.0 lb

## 2018-09-23 DIAGNOSIS — Z0000 Encounter for general adult medical examination without abnormal findings: Secondary | ICD-10-CM

## 2018-09-23 NOTE — Progress Notes (Signed)
Subjective:    Patient ID: Karla Jackson, female    DOB: [birth year], 52 y.o.   MRN: [id number]  HPI  Patient presents today for complete physical.  Immunizations: tetanus 2018, declines flu Diet: fairly healthy Wt Readings from Last 3 Encounters:  09/23/18 155 lb (70.3 kg)  08/28/17 153 lb 6.4 oz (69.6 kg)  07/25/17 154 lb (69.9 kg)  Exercise:  6-7 days a wee Colonoscopy: 04/20/17 Pap Smear: 10/09/13 Mammogram: 10/19.  Dental: up to date Vision: due, will schedule    Review of Systems  Constitutional: Negative for unexpected weight change.  HENT: Negative for hearing loss and rhinorrhea.   Eyes: Negative for visual disturbance.  Respiratory: Negative for cough.   Cardiovascular: Negative for leg swelling.  Gastrointestinal: Negative for diarrhea and vomiting.       Notes mild AM nausea  Genitourinary: Negative for dysuria, frequency and hematuria.  Musculoskeletal: Negative for arthralgias and myalgias.  Skin: Negative for rash.  Neurological: Positive for headaches.       Occasional headaches, thinks it is because she is not using her Bite guard  Hematological: Negative for adenopathy.  Psychiatric/Behavioral:       Denies depression/anxiety- grieving loss of her mom who passed in July- she feels like she is grieving appropriately   Past Medical History:  Diagnosis Date  . Anemia 08/2016     Social History   Socioeconomic History  . Marital status: Married    Spouse name: Not on file  . Number of children: Not on file  . Years of education: Not on file  . Highest education level: Not on file  Occupational History  . Not on file  Social Needs  . Financial resource strain: Not on file  . Food insecurity:    Worry: Not on file    Inability: Not on file  . Transportation needs:    Medical: Not on file    Non-medical: Not on file  Tobacco Use  . Smoking status: Never Smoker  . Smokeless tobacco: Never Used  Substance and Sexual Activity  . Alcohol  use: No  . Drug use: No  . Sexual activity: Not on file  Lifestyle  . Physical activity:    Days per week: Not on file    Minutes per session: Not on file  . Stress: Not on file  Relationships  . Social connections:    Talks on phone: Not on file    Gets together: Not on file    Attends religious service: Not on file    Active member of club or organization: Not on file    Attends meetings of clubs or organizations: Not on file    Relationship status: Not on file  . Intimate partner violence:    Fear of current or ex partner: Not on file    Emotionally abused: Not on file    Physically abused: Not on file    Forced sexual activity: Not on file  Other Topics Concern  . Not on file  Social History Narrative   3 sons- 54 and 35 (twins)    Married   Works as an Environmental health practitioner- [employer]   Enjoys yoga, walking the dog, listening to books on audible, mother has alzheimers    Enjoys outdoor activities     Past Surgical History:  Procedure Laterality Date  . CESAREAN SECTION  1998   1 time    Family History  Problem Relation Age of Onset  . Alzheimer's  disease Mother   . Arthritis Mother   . Scoliosis Mother   . Heart disease Father        Valve replacement surgery, died at 6785  . Hypertension Father   . Stroke Maternal Grandmother   . Scoliosis Sister   . Colon cancer Neg Hx   . Esophageal cancer Neg Hx   . Stomach cancer Neg Hx     Allergies  Allergen Reactions  . Ampicillin     rash  . Erythromycin     rash    No current outpatient medications on file prior to visit.   No current facility-administered medications on file prior to visit.     BP 121/66 (BP Location: Right Arm, Patient Position: Sitting, Cuff Size: Small)   Pulse 63   Temp 98.3 F (36.8 C) (Oral)   Resp 16   Ht 5\' 8"  (1.727 m)   Wt 155 lb (70.3 kg)   SpO2 100%   BMI 23.57 kg/m       Objective:   Physical Exam  Physical Exam  Constitutional: She is  oriented to person, place, and time. She appears well-developed and well-nourished. No distress.  HENT:  Head: Normocephalic and atraumatic.  Right Ear: Tympanic membrane and ear canal normal.  Left Ear: Tympanic membrane and ear canal normal.  Mouth/Throat: Oropharynx is clear and moist.  Eyes: Pupils are equal, round, and reactive to light. No scleral icterus.  Neck: Normal range of motion. No thyromegaly present.  Cardiovascular: Normal rate and regular rhythm.   No murmur heard. Pulmonary/Chest: Effort normal and breath sounds normal. No respiratory distress. He has no wheezes. She has no rales. She exhibits no tenderness.  Abdominal: Soft. Bowel sounds are normal. She exhibits no distension and no mass. There is no tenderness. There is no rebound and no guarding.  Musculoskeletal: She exhibits no edema.  Lymphadenopathy:    She has no cervical adenopathy.  Neurological: She is alert and oriented to person, place, and time. She has normal patellar reflexes. She exhibits normal muscle tone. Coordination normal.  Skin: Skin is warm and dry.  Psychiatric: She has a normal mood and affect. Her behavior is normal. Judgment and thought content normal.  Breasts: Examined lying Right: Without masses, retractions, discharge or axillary adenopathy.  Left: Without masses, retractions, discharge or axillary adenopathy.  Pelvic: deferred to GYN Assessment & Plan:          Assessment & Plan:  Preventative care- encouraged pt to continue healthy diet ane regular exercise. Tracing is personally reviewed.  EKG notes NSR.  No acute changes.

## 2018-09-23 NOTE — Patient Instructions (Addendum)
Please complete lab work prior to leaving.   

## 2018-09-24 LAB — BASIC METABOLIC PANEL
BUN: 18 mg/dL (ref 6–23)
CALCIUM: 9.1 mg/dL (ref 8.4–10.5)
CO2: 28 mEq/L (ref 19–32)
Chloride: 104 mEq/L (ref 96–112)
Creatinine, Ser: 0.68 mg/dL (ref 0.40–1.20)
GFR: 96.43 mL/min (ref >60.00)
Glucose, Bld: 112 mg/dL — ABNORMAL HIGH (ref 70–99)
Potassium: 4.3 mEq/L (ref 3.5–5.1)
Sodium: 140 mEq/L (ref 135–145)

## 2018-09-24 LAB — CBC WITH DIFFERENTIAL/PLATELET
Basophils Absolute: 0 10*3/uL (ref 0.0–0.1)
Basophils Relative: 0.5 % (ref 0.0–3.0)
Eosinophils Absolute: 0.1 10*3/uL (ref 0.0–0.7)
Eosinophils Relative: 1.7 % (ref 0.0–5.0)
HCT: 30.6 % — ABNORMAL LOW (ref 36.0–46.0)
Hemoglobin: 10 g/dL — ABNORMAL LOW (ref 12.0–15.0)
LYMPHS ABS: 1.4 10*3/uL (ref 0.7–4.0)
Lymphocytes Relative: 23.3 % (ref 12.0–46.0)
MCHC: 32.8 g/dL (ref 30.0–36.0)
MCV: 78.4 fl (ref 78.0–100.0)
Monocytes Absolute: 0.7 10*3/uL (ref 0.1–1.0)
Monocytes Relative: 11.6 % (ref 3.0–12.0)
NEUTROS ABS: 3.7 10*3/uL (ref 1.4–7.7)
NEUTROS PCT: 62.9 % (ref 43.0–77.0)
Platelets: 236 10*3/uL (ref 150.0–400.0)
RBC: 3.9 Mil/uL (ref 3.87–5.11)
RDW: 14.4 % (ref 11.5–15.5)
WBC: 5.9 10*3/uL (ref 4.0–10.5)

## 2018-09-24 LAB — URINALYSIS, ROUTINE W REFLEX MICROSCOPIC
Bilirubin Urine: NEGATIVE
Hgb urine dipstick: NEGATIVE
Ketones, ur: NEGATIVE
Leukocytes, UA: NEGATIVE
Nitrite: NEGATIVE
RBC / HPF: NONE SEEN (ref 0–?)
SPECIFIC GRAVITY, URINE: 1.01 (ref 1.000–1.030)
Total Protein, Urine: NEGATIVE
Urine Glucose: NEGATIVE
Urobilinogen, UA: 0.2 (ref 0.0–1.0)
pH: 6 (ref 5.0–8.0)

## 2018-09-24 LAB — LIPID PANEL
Cholesterol: 183 mg/dL (ref 0–200)
HDL: 73.6 mg/dL (ref >39.00)
LDL Cholesterol: 80 mg/dL (ref 0–99)
NonHDL: 109.01
Total CHOL/HDL Ratio: 2
Triglycerides: 147 mg/dL (ref 0.0–149.0)
VLDL: 29.4 mg/dL (ref 0.0–40.0)

## 2018-09-24 LAB — HEPATIC FUNCTION PANEL
ALT: 19 U/L (ref 0–35)
AST: 24 U/L (ref 0–37)
Albumin: 4.3 g/dL (ref 3.5–5.2)
Alkaline Phosphatase: 58 U/L (ref 39–117)
Bilirubin, Direct: 0 mg/dL (ref 0.0–0.3)
Total Bilirubin: 0.3 mg/dL (ref 0.2–1.2)
Total Protein: 6.7 g/dL (ref 6.0–8.3)

## 2018-09-24 LAB — TSH: TSH: 0.98 u[IU]/mL (ref 0.35–4.50)

## 2018-09-25 ENCOUNTER — Encounter: Payer: Self-pay | Admitting: Family

## 2018-09-25 ENCOUNTER — Telehealth: Payer: Self-pay | Admitting: Family

## 2018-09-25 DIAGNOSIS — N926 Irregular menstruation, unspecified: Secondary | ICD-10-CM | POA: Diagnosis not present

## 2018-09-25 DIAGNOSIS — Z01419 Encounter for gynecological examination (general) (routine) without abnormal findings: Secondary | ICD-10-CM | POA: Diagnosis not present

## 2018-09-25 DIAGNOSIS — D649 Anemia, unspecified: Secondary | ICD-10-CM | POA: Insufficient documentation

## 2018-09-25 NOTE — Telephone Encounter (Signed)
Please let pt know that she is anemic.  I would like her to complete IFOB kit and also please ask lab to add on serum iron and ferritin.  Sugar was mildly elevated but no tin the diabetes range.

## 2018-09-26 ENCOUNTER — Encounter: Payer: Self-pay | Admitting: Family

## 2018-09-26 ENCOUNTER — Other Ambulatory Visit: Payer: Self-pay

## 2018-09-26 ENCOUNTER — Other Ambulatory Visit (INDEPENDENT_AMBULATORY_CARE_PROVIDER_SITE_OTHER): Payer: BLUE CROSS/BLUE SHIELD

## 2018-09-26 ENCOUNTER — Telehealth: Payer: Self-pay | Admitting: Internal Medicine

## 2018-09-26 DIAGNOSIS — D649 Anemia, unspecified: Secondary | ICD-10-CM | POA: Diagnosis not present

## 2018-09-26 LAB — IRON: IRON: 25 ug/dL — AB (ref 42–145)

## 2018-09-26 LAB — FERRITIN: FERRITIN: 5.5 ng/mL — AB (ref 10.0–291.0)

## 2018-09-26 MED ORDER — FERROUS SULFATE 325 (65 FE) MG PO TABS: 325.0000 mg | ORAL_TABLET | Freq: Two times a day (BID) | ORAL | 3 refills | Status: DC

## 2018-09-26 NOTE — Addendum Note (Signed)
Addended by: Suzane Vanderweide S on: 09/26/2018 11:02 AM   Modules accepted: Orders  

## 2018-09-26 NOTE — Addendum Note (Signed)
Addended by: Miguel AschoffLANCASTER, Elveta Rape S on: 09/26/2018 11:02 AM   Modules accepted: Orders

## 2018-09-26 NOTE — Telephone Encounter (Signed)
Please call patient, blood work showed that she has iron deficiency. I reviewed her chart, she had a normal colonoscopy in 2018.  She saw her gynecologist yesterday and she was diagnosed with menorrhagia which may account for her iron def. Recommend to take iron 325 mg twice daily, send #60 and 6 refills Also multivitamin. Come back and see PCP in 4 to 6 weeks, might need further evaluation like a EGD. Willow OraJose Paz, MD  covering for [employer]

## 2018-09-26 NOTE — Telephone Encounter (Signed)
Future orders placed for [facility] and add on form faxed to [facility] to send blood to [facility].

## 2018-09-26 NOTE — Telephone Encounter (Signed)
rx sent lm for patient to be aware and to call for us back

## 2018-09-26 NOTE — Telephone Encounter (Signed)
Talked to patient about results, advised she will receive IFOB test in the mail. Will try for [place] to sent blood to [facility] for serum iron and ferritin. If they are not able to will bring patient back for additional testing.

## 2018-09-27 ENCOUNTER — Other Ambulatory Visit: Payer: Self-pay

## 2018-09-27 DIAGNOSIS — D649 Anemia, unspecified: Secondary | ICD-10-CM

## 2018-09-27 NOTE — Telephone Encounter (Signed)
Patient advised of additional results, she picked up iron yesterday, she will call back to schedule repeat labs iron, ferritin and cbc in 6 weeks.

## 2018-09-28 LAB — VITAMIN D 1,25 DIHYDROXY
Vitamin D 1, 25 (OH)2 Total: 50 pg/mL (ref 18–72)
Vitamin D2 1, 25 (OH)2: <8 pg/mL
Vitamin D3 1, 25 (OH)2: 50 pg/mL

## 2018-09-29 NOTE — Telephone Encounter (Signed)
Please also let her know that Vit D is normal.  I would like her to complete an IFOB kit as well please, dx iron def anemia.

## 2018-09-30 ENCOUNTER — Other Ambulatory Visit: Payer: Self-pay

## 2018-09-30 DIAGNOSIS — D649 Anemia, unspecified: Secondary | ICD-10-CM

## 2018-09-30 NOTE — Telephone Encounter (Signed)
Patient advised of resutls IFOB was mailed out to patient last week, ordered test as future.

## 2018-10-06 ENCOUNTER — Encounter: Payer: Self-pay | Admitting: Family

## 2018-10-15 ENCOUNTER — Other Ambulatory Visit (INDEPENDENT_AMBULATORY_CARE_PROVIDER_SITE_OTHER): Payer: BLUE CROSS/BLUE SHIELD

## 2018-10-15 DIAGNOSIS — D649 Anemia, unspecified: Secondary | ICD-10-CM

## 2018-10-15 LAB — FECAL OCCULT BLOOD, IMMUNOCHEMICAL: Fecal Occult Bld: NEGATIVE

## 2018-10-28 ENCOUNTER — Other Ambulatory Visit: Payer: Self-pay | Admitting: Family

## 2018-10-28 ENCOUNTER — Encounter: Payer: Self-pay | Admitting: Family

## 2018-10-28 ENCOUNTER — Ambulatory Visit: Payer: BLUE CROSS/BLUE SHIELD | Admitting: Family

## 2018-10-28 VITALS — BP 121/85 | HR 60 | Temp 98.0°F | Resp 16 | Ht 67.0 in | Wt 157.0 lb

## 2018-10-28 DIAGNOSIS — D509 Iron deficiency anemia, unspecified: Secondary | ICD-10-CM | POA: Diagnosis not present

## 2018-10-28 LAB — CBC WITH DIFFERENTIAL/PLATELET
Basophils Absolute: 0 10*3/uL (ref 0.0–0.1)
Basophils Relative: 0.7 % (ref 0.0–3.0)
Eosinophils Absolute: 0 10*3/uL (ref 0.0–0.7)
Eosinophils Relative: 0.8 % (ref 0.0–5.0)
HCT: 38.1 % (ref 36.0–46.0)
Hemoglobin: 12.7 g/dL (ref 12.0–15.0)
LYMPHS ABS: 1.2 10*3/uL (ref 0.7–4.0)
Lymphocytes Relative: 22.6 % (ref 12.0–46.0)
MCHC: 33.3 g/dL (ref 30.0–36.0)
MCV: 80 fl (ref 78.0–100.0)
Monocytes Absolute: 0.4 10*3/uL (ref 0.1–1.0)
Monocytes Relative: 6.5 % (ref 3.0–12.0)
NEUTROS PCT: 69.4 % (ref 43.0–77.0)
Neutro Abs: 3.8 10*3/uL (ref 1.4–7.7)
Platelets: 211 10*3/uL (ref 150.0–400.0)
RBC: 4.76 Mil/uL (ref 3.87–5.11)
RDW: 17.3 % — AB (ref 11.5–15.5)
WBC: 5.5 10*3/uL (ref 4.0–10.5)

## 2018-10-28 LAB — B12 AND FOLATE PANEL
Folate: 21.4 ng/mL (ref >5.9)
VITAMIN B 12: 510 pg/mL (ref 211–911)

## 2018-10-28 LAB — FERRITIN: Ferritin: 26.7 ng/mL (ref 10.0–291.0)

## 2018-10-28 LAB — IRON: Iron: 137 ug/dL (ref 42–145)

## 2018-10-28 MED ORDER — FERROUS SULFATE 325 (65 FE) MG PO TABS: 325.0000 mg | ORAL_TABLET | Freq: Every day | ORAL | 3 refills | Status: DC

## 2018-10-28 NOTE — Patient Instructions (Signed)
Please complete lab work prior to leaving.   

## 2018-10-28 NOTE — Progress Notes (Signed)
Subjective:    Patient ID: Karla Jackson, female    DOB: [birth year], 53 y.o.   MRN: [id number]  HPI  Patient is a 53 yr old female who presents today for follow up.  Anemia- IFOB was negative.  Had colo 04/20/18. Maintained on iron bid. She denies fatigue.  Tolerating iron without difficulty. Denies any unusually heavy Menses.  Having irregular periods though. Denies hot flashes.    Lab Results  Component Value Date   WBC 5.9 09/23/2018   HGB 10.0 (L) 09/23/2018   HCT 30.6 (L) 09/23/2018   MCV 78.4 09/23/2018   PLT 236.0 09/23/2018      Review of Systems    see HPI  Past Medical History:  Diagnosis Date  . Anemia 08/2016     Social History   Socioeconomic History  . Marital status: Married    Spouse name: Not on file  . Number of children: Not on file  . Years of education: Not on file  . Highest education level: Not on file  Occupational History  . Not on file  Social Needs  . Financial resource strain: Not on file  . Food insecurity:    Worry: Not on file    Inability: Not on file  . Transportation needs:    Medical: Not on file    Non-medical: Not on file  Tobacco Use  . Smoking status: Never Smoker  . Smokeless tobacco: Never Used  Substance and Sexual Activity  . Alcohol use: No  . Drug use: No  . Sexual activity: Not on file  Lifestyle  . Physical activity:    Days per week: Not on file    Minutes per session: Not on file  . Stress: Not on file  Relationships  . Social connections:    Talks on phone: Not on file    Gets together: Not on file    Attends religious service: Not on file    Active member of club or organization: Not on file    Attends meetings of clubs or organizations: Not on file    Relationship status: Not on file  . Intimate partner violence:    Fear of current or ex partner: Not on file    Emotionally abused: Not on file    Physically abused: Not on file    Forced sexual activity: Not on file  Other Topics Concern    . 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 CESAREAN SECTION  1998   1 time    Family History  Problem Relation Age of Onset  . Alzheimer's disease Mother   . Arthritis Mother   . Scoliosis Mother   . Heart disease Father        Valve replacement surgery, died at 77  . Hypertension Father   . Stroke Maternal Grandmother   . Scoliosis Sister   . Colon cancer Neg Hx   . Esophageal cancer Neg Hx   . Stomach cancer Neg Hx     Allergies  Allergen Reactions  . Ampicillin     rash  . Erythromycin     rash    Current Outpatient Medications on File Prior  to Visit  Medication Sig Dispense Refill  . ferrous sulfate 325 (65 FE) MG tablet Take 1 tablet (325 mg total) by mouth 2 (two) times daily with a meal. 60 tablet 3   No current facility-administered medications on file prior to visit.     BP 121/85 (BP Location: Right Arm, Patient Position: Sitting, Cuff Size: Small)   Pulse 60   Temp 98 F (36.7 C) (Oral)   Resp 16   Ht 5\' 7"  (1.702 m)   Wt 157 lb (71.2 kg)   SpO2 100%   BMI 24.59 kg/m    Objective:   Physical Exam Constitutional:      Appearance: She is well-developed.  Neck:     Musculoskeletal: Neck supple.     Thyroid: No thyromegaly.  Cardiovascular:     Rate and Rhythm: Normal rate and regular rhythm.     Heart sounds: Normal heart sounds. No murmur.  Pulmonary:     Effort: Pulmonary effort is normal. No respiratory distress.     Breath sounds: Normal breath sounds. No wheezing.  Skin:    General: Skin is warm and dry.  Neurological:     Mental Status: She is alert and oriented to person, place, and time.  Psychiatric:        Behavior: Behavior normal.        Thought  Content: Thought content normal.        Judgment: Judgment normal.           Assessment & Plan:  Iron deficiency anemia- will repeat CBC, along with iron studies/b12 and folate. Further recommendations following review of these labs. Continue iron.

## 2018-11-18 DIAGNOSIS — R6889 Other general symptoms and signs: Secondary | ICD-10-CM | POA: Diagnosis not present

## 2018-11-20 DIAGNOSIS — R030 Elevated blood-pressure reading, without diagnosis of hypertension: Secondary | ICD-10-CM | POA: Diagnosis not present

## 2018-11-20 DIAGNOSIS — M5020 Other cervical disc displacement, unspecified cervical region: Secondary | ICD-10-CM | POA: Diagnosis not present

## 2019-03-26 ENCOUNTER — Other Ambulatory Visit: Payer: Self-pay

## 2019-03-26 MED ORDER — FERROUS SULFATE 325 (65 FE) MG PO TABS: 325.0000 mg | ORAL_TABLET | Freq: Every day | ORAL | 3 refills | Status: DC

## 2019-09-30 DIAGNOSIS — Z1231 Encounter for screening mammogram for malignant neoplasm of breast: Secondary | ICD-10-CM | POA: Diagnosis not present

## 2019-10-01 DIAGNOSIS — R0602 Shortness of breath: Secondary | ICD-10-CM | POA: Diagnosis not present

## 2019-10-01 DIAGNOSIS — N925 Other specified irregular menstruation: Secondary | ICD-10-CM | POA: Diagnosis not present

## 2019-10-01 DIAGNOSIS — Z01419 Encounter for gynecological examination (general) (routine) without abnormal findings: Secondary | ICD-10-CM | POA: Diagnosis not present

## 2019-10-01 DIAGNOSIS — N852 Hypertrophy of uterus: Secondary | ICD-10-CM | POA: Diagnosis not present

## 2019-10-17 ENCOUNTER — Other Ambulatory Visit: Payer: Self-pay

## 2019-10-17 ENCOUNTER — Encounter: Payer: Self-pay | Admitting: Family

## 2019-10-17 ENCOUNTER — Ambulatory Visit (INDEPENDENT_AMBULATORY_CARE_PROVIDER_SITE_OTHER): Payer: BC Managed Care – PPO | Admitting: Family

## 2019-10-17 VITALS — BP 119/78 | HR 69 | Temp 97.3°F | Resp 16 | Ht 68.0 in | Wt 152.0 lb

## 2019-10-17 DIAGNOSIS — L989 Disorder of the skin and subcutaneous tissue, unspecified: Secondary | ICD-10-CM | POA: Diagnosis not present

## 2019-10-17 DIAGNOSIS — D509 Iron deficiency anemia, unspecified: Secondary | ICD-10-CM | POA: Diagnosis not present

## 2019-10-17 DIAGNOSIS — R0602 Shortness of breath: Secondary | ICD-10-CM

## 2019-10-17 DIAGNOSIS — R0609 Other forms of dyspnea: Secondary | ICD-10-CM

## 2019-10-17 DIAGNOSIS — E785 Hyperlipidemia, unspecified: Secondary | ICD-10-CM | POA: Diagnosis not present

## 2019-10-17 DIAGNOSIS — R0600 Dyspnea, unspecified: Secondary | ICD-10-CM | POA: Diagnosis not present

## 2019-10-17 DIAGNOSIS — Z0001 Encounter for general adult medical examination with abnormal findings: Secondary | ICD-10-CM | POA: Diagnosis not present

## 2019-10-17 DIAGNOSIS — Z0000 Encounter for general adult medical examination without abnormal findings: Secondary | ICD-10-CM

## 2019-10-17 NOTE — Progress Notes (Signed)
Subjective:    Patient ID: Karla Jackson, female    DOB: 01-08-1966, 54 y.o.   MRN: [id number]  HPI  Patient presents today for complete physical.  Immunizations: declines shingrix, declines flu shot  Diet: healthy Wt Readings from Last 3 Encounters:  10/17/19 152 lb (68.9 kg)  10/28/18 157 lb (71.2 kg)  09/23/18 155 lb (70.3 kg)  Exercise: reports that she has a pelaton and yoga Colonoscopy: 04/20/17 Pap Smear: -2018,  gyn will repeat in 2023 Mammogram: 09/30/19 Vision: 1 year ago Dental:  Up to date  Reports that she has some sob when she is walking up a hill which is disproportionate.    She denies associated wheezing.      Review of Systems  Constitutional: Negative for unexpected weight change.  HENT: Negative for hearing loss and rhinorrhea.   Eyes: Negative for visual disturbance.  Respiratory: Positive for shortness of breath. Negative for cough.   Cardiovascular: Negative for chest pain (some heaviness that she attributes to stress.  ).  Genitourinary: Positive for menstrual problem (perimenopausal irreg menses). Negative for dysuria and frequency.       Perimenopausal- irregular periods  Musculoskeletal: Negative for arthralgias and myalgias.  Skin: Negative for rash.  Neurological: Negative for headaches.       Using a bite guard which helps reduce HA's  Hematological: Negative for adenopathy.  Psychiatric/Behavioral:       Denies depression   Past Medical History:  Diagnosis Date  . Anemia 08/2016     Social History   Socioeconomic History  . Marital status: Married    Spouse name: Not on file  . Number of children: Not on file  . Years of education: Not on file  . Highest education level: Not on file  Occupational History  . Not on file  Tobacco Use  . Smoking status: Never Smoker  . Smokeless tobacco: Never Used  Substance and Sexual Activity  . Alcohol use: No  . Drug use: No  . Sexual activity: Not on file  Other Topics Concern  .  Not on file  Social History Narrative   3 sons- 47 and 64 (twins)    Married   Works as an Environmental health practitioner- [employer]   Enjoys yoga, walking the dog, listening to books on audible, mother has Archivist    Enjoys outdoor activities    Social Determinants of Corporate investment banker Strain:   . Difficulty of Paying Living Expenses: Not on file  Food Insecurity:   . Worried About Programme researcher, broadcasting/film/video in the Last Year: Not on file  . Ran Out of Food in the Last Year: Not on file  Transportation Needs:   . Lack of Transportation (Medical): Not on file  . Lack of Transportation (Non-Medical): Not on file  Physical Activity:   . Days of Exercise per Week: Not on file  . Minutes of Exercise per Session: Not on file  Stress:   . Feeling of Stress : Not on file  Social Connections:   . Frequency of Communication with Friends and Family: Not on file  . Frequency of Social Gatherings with Friends and Family: Not on file  . Attends Religious Services: Not on file  . Active Member of Clubs or Organizations: Not on file  . Attends Banker Meetings: Not on file  . Marital Status: Not on file  Intimate Partner Violence:   . Fear of Current or Ex-Partner: Not on file  .  Emotionally Abused: Not on file  . Physically Abused: Not on file  . Sexually Abused: Not on file    Past Surgical History:  Procedure Laterality Date  . CESAREAN SECTION  1998   1 time    Family History  Problem Relation Age of Onset  . Alzheimer's disease Mother        died 2023/05/07  . Arthritis Mother   . Scoliosis Mother   . Heart disease Father        Valve replacement surgery, died at 58  . Hypertension Father   . Stroke Maternal Grandmother   . Scoliosis Sister   . Colon cancer Neg Hx   . Esophageal cancer Neg Hx   . Stomach cancer Neg Hx     Allergies  Allergen Reactions  . Ampicillin     rash  . Erythromycin     rash    Current Outpatient Medications on  File Prior to Visit  Medication Sig Dispense Refill  . ferrous sulfate 325 (65 FE) MG tablet Take 1 tablet (325 mg total) by mouth daily with breakfast. 60 tablet 3   No current facility-administered medications on file prior to visit.    BP 119/78 (BP Location: Left Arm, Patient Position: Sitting, Cuff Size: Small)   Pulse 69   Temp (!) 97.3 F (36.3 C) (Temporal)   Resp 16   Ht 5\' 8"  (1.727 m)   Wt 152 lb (68.9 kg)   SpO2 99%   BMI 23.11 kg/m        Objective:   Physical Exam  Physical Exam  Constitutional: She is oriented to person, place, and time. She appears well-developed and well-nourished. No distress.  HENT:  Head: Normocephalic and atraumatic.  Right Ear: Tympanic membrane and ear canal normal.  Left Ear: Tympanic membrane and ear canal normal.  Mouth/Throat: Not examined- pt wearing maskl Eyes: Pupils are equal, round, and reactive to light. No scleral icterus.  Neck: Normal range of motion. No thyromegaly present.  Cardiovascular: Normal rate and regular rhythm.   No murmur heard. Pulmonary/Chest: Effort normal and breath sounds normal. No respiratory distress. He has no wheezes. She has no rales. She exhibits no tenderness.  Abdominal: Soft. Bowel sounds are normal. She exhibits no distension and no mass. There is no tenderness. There is no rebound and no guarding.  Musculoskeletal: She exhibits no edema.  Lymphadenopathy:    She has no cervical adenopathy.  Neurological: She is alert and oriented to person, place, and time. She has normal patellar reflexes. She exhibits normal muscle tone. Coordination normal.  Skin: Skin is warm and dry. flat, pink lesion noted on left nasal bridge Psychiatric: She has a normal mood and affect. Her behavior is normal. Judgment and thought content normal.  Breast/pelvic: deferred to [place]:   Preventative care- declines shingrix, flu shot.  Tetanus, pap, mammo and colo up to date. Encouraged  pt to continue healthy diet and regular exercise.  Skin lesion- will refer to dermatology for skin check.  DOE- had neg stress test in 2018. EKG tracing is personally reviewed.  EKG notes NSR.  No acute changes.  Recommended follow up with cardiology for possible additional testing. Will also add albuterol prn with exercise.   Iron def anemia- currently on iron supplement. Iron level is high. D/C iron Assessment & Plan:

## 2019-10-17 NOTE — Patient Instructions (Addendum)
Please complete lab work prior to leaving. You should be contacted about your referral to Dr. Thomes Lolling and Dermatology. Go to the ER if you develop worsening shortness of breath or chest pain.

## 2019-10-18 LAB — HEPATIC FUNCTION PANEL
AG Ratio: 1.9 (calc) (ref 1.0–2.5)
ALT: 16 U/L (ref 6–29)
AST: 19 U/L (ref 10–35)
Albumin: 4.3 g/dL (ref 3.6–5.1)
Alkaline phosphatase (APISO): 54 U/L (ref 37–153)
Bilirubin, Direct: 0.1 mg/dL (ref 0.0–0.2)
Globulin: 2.3 g/dL (calc) (ref 1.9–3.7)
Indirect Bilirubin: 0.4 mg/dL (calc) (ref 0.2–1.2)
Total Bilirubin: 0.5 mg/dL (ref 0.2–1.2)
Total Protein: 6.6 g/dL (ref 6.1–8.1)

## 2019-10-18 LAB — BASIC METABOLIC PANEL
BUN: 16 mg/dL (ref 7–25)
CO2: 26 mmol/L (ref 20–32)
Calcium: 9.1 mg/dL (ref 8.6–10.4)
Chloride: 105 mmol/L (ref 98–110)
Creat: 0.78 mg/dL (ref 0.50–1.05)
Glucose, Bld: 87 mg/dL (ref 65–99)
Potassium: 4.7 mmol/L (ref 3.5–5.3)
Sodium: 137 mmol/L (ref 135–146)

## 2019-10-18 LAB — CBC WITH DIFFERENTIAL/PLATELET
Absolute Monocytes: 491 cells/uL (ref 200–950)
Basophils Absolute: 38 cells/uL (ref 0–200)
Basophils Relative: 0.6 %
Eosinophils Absolute: 151 cells/uL (ref 15–500)
Eosinophils Relative: 2.4 %
HCT: 38.7 % (ref 35.0–45.0)
Hemoglobin: 12.9 g/dL (ref 11.7–15.5)
Lymphs Abs: 1361 cells/uL (ref 850–3900)
MCH: 27.7 pg (ref 27.0–33.0)
MCHC: 33.3 g/dL (ref 32.0–36.0)
MCV: 83 fL (ref 80.0–100.0)
MPV: 9.6 fL (ref 7.5–12.5)
Monocytes Relative: 7.8 %
Neutro Abs: 4259 cells/uL (ref 1500–7800)
Neutrophils Relative %: 67.6 %
Platelets: 210 10*3/uL (ref 140–400)
RBC: 4.66 10*6/uL (ref 3.80–5.10)
RDW: 12.8 % (ref 11.0–15.0)
Total Lymphocyte: 21.6 %
WBC: 6.3 10*3/uL (ref 3.8–10.8)

## 2019-10-18 LAB — LIPID PANEL
Cholesterol: 201 mg/dL — ABNORMAL HIGH (ref <200)
HDL: 72 mg/dL (ref >=50)
LDL Cholesterol (Calc): 111 mg/dL (calc) — ABNORMAL HIGH
Non-HDL Cholesterol (Calc): 129 mg/dL (calc) (ref <130)
Total CHOL/HDL Ratio: 2.8 (calc) (ref <5.0)
Triglycerides: 89 mg/dL (ref <150)

## 2019-10-18 LAB — TSH: TSH: 0.96 mIU/L

## 2019-10-18 LAB — IRON: Iron: 169 ug/dL — ABNORMAL HIGH (ref 45–160)

## 2019-10-20 ENCOUNTER — Telehealth: Payer: Self-pay | Admitting: Family

## 2019-10-20 MED ORDER — ALBUTEROL SULFATE HFA 108 (90 BASE) MCG/ACT IN AERS: 2.0000 | INHALATION_SPRAY | Freq: Four times a day (QID) | RESPIRATORY_TRACT | 0 refills | Status: DC | PRN

## 2019-10-20 NOTE — Telephone Encounter (Signed)
Advised patient of new rx, how and when to use and to let us know if it works for her. Also advised of elevated iron level and to dc iron supplements at this time. She verbalized understanding.

## 2019-10-20 NOTE — Telephone Encounter (Signed)
Also, iron level is too high. She should stop iron supplement.

## 2019-10-20 NOTE — Telephone Encounter (Signed)
Please let pt know that I sent an albuterol inhaler for her to try as needed if she becomes short of breath with exercise. Let me know if it helps.

## 2019-10-21 DIAGNOSIS — N8301 Follicular cyst of right ovary: Secondary | ICD-10-CM | POA: Diagnosis not present

## 2019-10-21 DIAGNOSIS — N852 Hypertrophy of uterus: Secondary | ICD-10-CM | POA: Diagnosis not present

## 2019-10-23 ENCOUNTER — Encounter: Payer: Self-pay | Admitting: Family

## 2019-11-13 DIAGNOSIS — R001 Bradycardia, unspecified: Secondary | ICD-10-CM | POA: Diagnosis not present

## 2019-11-13 DIAGNOSIS — I517 Cardiomegaly: Secondary | ICD-10-CM | POA: Diagnosis not present

## 2019-11-13 DIAGNOSIS — R0600 Dyspnea, unspecified: Secondary | ICD-10-CM | POA: Diagnosis not present

## 2019-11-13 DIAGNOSIS — R011 Cardiac murmur, unspecified: Secondary | ICD-10-CM | POA: Diagnosis not present

## 2019-12-05 DIAGNOSIS — R011 Cardiac murmur, unspecified: Secondary | ICD-10-CM | POA: Diagnosis not present

## 2019-12-05 DIAGNOSIS — R0600 Dyspnea, unspecified: Secondary | ICD-10-CM | POA: Diagnosis not present

## 2019-12-30 DIAGNOSIS — R0609 Other forms of dyspnea: Secondary | ICD-10-CM | POA: Diagnosis not present

## 2019-12-30 DIAGNOSIS — R011 Cardiac murmur, unspecified: Secondary | ICD-10-CM | POA: Diagnosis not present

## 2019-12-30 DIAGNOSIS — Z8249 Family history of ischemic heart disease and other diseases of the circulatory system: Secondary | ICD-10-CM | POA: Diagnosis not present

## 2020-01-28 DIAGNOSIS — N920 Excessive and frequent menstruation with regular cycle: Secondary | ICD-10-CM | POA: Diagnosis not present

## 2020-09-28 DIAGNOSIS — Z20822 Contact with and (suspected) exposure to covid-19: Secondary | ICD-10-CM | POA: Diagnosis not present

## 2020-10-19 DIAGNOSIS — Z1231 Encounter for screening mammogram for malignant neoplasm of breast: Secondary | ICD-10-CM | POA: Diagnosis not present

## 2020-11-03 DIAGNOSIS — R928 Other abnormal and inconclusive findings on diagnostic imaging of breast: Secondary | ICD-10-CM | POA: Diagnosis not present

## 2020-11-03 DIAGNOSIS — R922 Inconclusive mammogram: Secondary | ICD-10-CM | POA: Diagnosis not present

## 2020-11-05 DIAGNOSIS — Z23 Encounter for immunization: Secondary | ICD-10-CM | POA: Diagnosis not present

## 2021-03-13 ENCOUNTER — Encounter: Payer: Self-pay | Admitting: Family

## 2021-03-14 MED ORDER — SCOPOLAMINE 1 MG/3DAYS TD PT72: 1.0000 | MEDICATED_PATCH | TRANSDERMAL | 0 refills | Status: DC

## 2021-04-13 DIAGNOSIS — L819 Disorder of pigmentation, unspecified: Secondary | ICD-10-CM | POA: Diagnosis not present

## 2021-04-15 ENCOUNTER — Encounter: Payer: Self-pay | Admitting: Family

## 2021-04-20 ENCOUNTER — Encounter: Payer: Self-pay | Admitting: Family

## 2021-04-20 ENCOUNTER — Other Ambulatory Visit: Payer: Self-pay

## 2021-04-20 ENCOUNTER — Ambulatory Visit: Payer: BC Managed Care – PPO | Attending: Internal Medicine

## 2021-04-20 ENCOUNTER — Telehealth: Payer: Self-pay | Admitting: Family

## 2021-04-20 ENCOUNTER — Ambulatory Visit (INDEPENDENT_AMBULATORY_CARE_PROVIDER_SITE_OTHER): Payer: BC Managed Care – PPO | Admitting: Family

## 2021-04-20 VITALS — BP 118/78 | HR 66 | Temp 99.1°F | Resp 18 | Ht 68.0 in | Wt 147.4 lb

## 2021-04-20 DIAGNOSIS — Z23 Encounter for immunization: Secondary | ICD-10-CM

## 2021-04-20 DIAGNOSIS — D649 Anemia, unspecified: Secondary | ICD-10-CM

## 2021-04-20 DIAGNOSIS — E785 Hyperlipidemia, unspecified: Secondary | ICD-10-CM | POA: Diagnosis not present

## 2021-04-20 DIAGNOSIS — Z0000 Encounter for general adult medical examination without abnormal findings: Secondary | ICD-10-CM | POA: Diagnosis not present

## 2021-04-20 LAB — LIPID PANEL
Cholesterol: 192 mg/dL (ref 0–200)
HDL: 67.7 mg/dL (ref >39.00)
LDL Cholesterol: 114 mg/dL — ABNORMAL HIGH (ref 0–99)
NonHDL: 124.48
Total CHOL/HDL Ratio: 3
Triglycerides: 53 mg/dL (ref 0.0–149.0)
VLDL: 10.6 mg/dL (ref 0.0–40.0)

## 2021-04-20 LAB — CBC WITH DIFFERENTIAL/PLATELET
Basophils Absolute: 0 10*3/uL (ref 0.0–0.1)
Basophils Relative: 0.5 % (ref 0.0–3.0)
Eosinophils Absolute: 0.1 10*3/uL (ref 0.0–0.7)
Eosinophils Relative: 2 % (ref 0.0–5.0)
HCT: 37.7 % (ref 36.0–46.0)
Hemoglobin: 12.6 g/dL (ref 12.0–15.0)
Lymphocytes Relative: 28.7 % (ref 12.0–46.0)
Lymphs Abs: 1.4 10*3/uL (ref 0.7–4.0)
MCHC: 33.4 g/dL (ref 30.0–36.0)
MCV: 80.7 fl (ref 78.0–100.0)
Monocytes Absolute: 0.3 10*3/uL (ref 0.1–1.0)
Monocytes Relative: 6.6 % (ref 3.0–12.0)
Neutro Abs: 3 10*3/uL (ref 1.4–7.7)
Neutrophils Relative %: 62.2 % (ref 43.0–77.0)
Platelets: 195 10*3/uL (ref 150.0–400.0)
RBC: 4.67 Mil/uL (ref 3.87–5.11)
RDW: 12.6 % (ref 11.5–15.5)
WBC: 4.9 10*3/uL (ref 4.0–10.5)

## 2021-04-20 LAB — COMPREHENSIVE METABOLIC PANEL
ALT: 20 U/L (ref 0–35)
AST: 24 U/L (ref 0–37)
Albumin: 4.4 g/dL (ref 3.5–5.2)
Alkaline Phosphatase: 56 U/L (ref 39–117)
BUN: 25 mg/dL — ABNORMAL HIGH (ref 6–23)
CO2: 30 mEq/L (ref 19–32)
Calcium: 9.5 mg/dL (ref 8.4–10.5)
Chloride: 105 mEq/L (ref 96–112)
Creatinine, Ser: 0.77 mg/dL (ref 0.40–1.20)
GFR: 87.12 mL/min (ref >60.00)
Glucose, Bld: 86 mg/dL (ref 70–99)
Potassium: 4.5 mEq/L (ref 3.5–5.1)
Sodium: 141 mEq/L (ref 135–145)
Total Bilirubin: 0.4 mg/dL (ref 0.2–1.2)
Total Protein: 6.9 g/dL (ref 6.0–8.3)

## 2021-04-20 NOTE — Telephone Encounter (Signed)
Medical release form faxed to [phone]

## 2021-04-20 NOTE — Assessment & Plan Note (Signed)
Encouraged pt to continue healthy diet and regular exercise.  Recommended that she obtain covid booster #2 at our pharmacy on the first floor. Mammo up to date. Pap up to date per GYN.  Declines shingrix.

## 2021-04-20 NOTE — Patient Instructions (Signed)
Please complete lab work prior to leaving.   

## 2021-04-20 NOTE — Progress Notes (Signed)
   Covid-19 Vaccination Clinic  Name:  Karla Jackson    MRN: [id number] DOB: Jan 16, 1966  04/20/2021  Karla Jackson was observed post Covid-19 immunization for 15 minutes without incident. She was provided with Vaccine Information Sheet and instruction to access the V-Safe system.   Karla Jackson was instructed to call 911 with any severe reactions post vaccine: Difficulty breathing  Swelling of face and throat  A fast heartbeat  A bad rash all over body  Dizziness and weakness   Immunizations Administered     Name Date Dose VIS Date Route   PFIZER Comrnaty(Gray TOP) Covid-19 Vaccine 04/20/2021 10:52 AM 0.3 mL 09/16/2020 Intramuscular   Manufacturer: [employer], [employer]   Lot: [id number]   NDC: [phone]

## 2021-04-20 NOTE — Progress Notes (Signed)
Subjective:   By signing my name below, I, Shehryar Baig, attest that this documentation has been prepared under the direction and in the presence of Debbrah Alar NP. 04/20/2021    Patient ID: Karla Jackson, female    DOB: 01/22/1966, 55 y.o.   MRN: [id number]  Chief Complaint  Patient presents with   Annual Exam    Pt states not fasting     HPI Patient is in today for a comprehensive physical exam. She denies having any unexpected weight change, hearing loss and rhinorrhea, visual disturbance, cough, chest pain and leg swelling, nausea, vomiting, constipation, diarrhea and blood in stool, or dysuria and frequency, for myalgias and arthralgias, rash, headaches, adenopathy, depression or anxiety at this time. She has no recent changes to her family medical history. She has had no recent surgeries. She does not drink alcohol, she does no use drugs, she has never smoked before.  Migraine- She had two migraine in the past month. She manages her symptoms by taking Advil and going to sleep. She reports her migraine subsides after she wakes up. She thinks they may be related to her stress. Menstrual cycle- She no longer has menstrual cycles at this time.  Dyspnea- She no longer has symptoms of dyspnea on exertion. Chest pain- She reports no longer having unusual chest pain. Depression/Anxiety- She is managing her depression and anxiety. She reports that it comes and goes.   Immunizations: She has 3 Covid-19 vaccines and is willing to get the 2nd booster at a later time. She is UTD on tetanus vaccines. She is due for the shingles vaccine and is not willing to get it at this time. She is not interested in HIV or hepatitis screening at this time. Diet: She is managing a healthy diet. Exercise: She participates in regular exercise by walking, lifting weights and doing yoga. Colonoscopy: Last completed 04/20/2017. Results showed non-bleeding internal hemorrhoids, otherwise results are  normal.  Pap Smear: She completes her pap smears at her GYN's office. She is due for another one on December, 2023.  Mammogram: Last completed 09/30/2019. Results normal. Repeat in 1 year. Dental: She is UTD on dental care. Vision: She is due for vision care.  Health Maintenance Due  Topic Date Due   Zoster Vaccines- Shingrix (1 of 2) Never done   PAP SMEAR-Modifier  10/09/2016   COVID-19 Vaccine (4 - Booster for [employer] series) 03/05/2021    Past Medical History:  Diagnosis Date   Anemia 08/2016    Past Surgical History:  Procedure Laterality Date   CESAREAN SECTION  1998   1 time    Family History  Problem Relation Age of Onset   Alzheimer's disease Mother        died 05/20/23   Arthritis Mother    Scoliosis Mother    Heart disease Father        Valve replacement surgery, died at 34   Hypertension Father    Stroke Maternal Grandmother    Scoliosis Sister    Colon cancer Neg Hx    Esophageal cancer Neg Hx    Stomach cancer Neg Hx     Social History   Socioeconomic History   Marital status: Married    Spouse name: Not on file   Number of children: Not on file   Years of education: Not on file   Highest education level: Not on file  Occupational History   Not on file  Tobacco Use   Smoking status: Never  Smokeless tobacco: Never  Substance and Sexual Activity   Alcohol use: No   Drug use: No   Sexual activity: Not on file  Other Topics Concern   Not on file  Social History Narrative   3 sons- 47 and 77 (twins)    Married   Works as an Web designer- [employer]   Enjoys yoga, walking the dog, listening to books on audible, mother has Estate manager/land agent    Enjoys outdoor activities    Social Determinants of Radio broadcast assistant Strain: Not on Art therapist Insecurity: Not on file  Transportation Needs: Not on file  Physical Activity: Not on file  Stress: Not on file  Social Connections: Not on file  Intimate Partner Violence:  Not on file    Outpatient Medications Prior to Visit  Medication Sig Dispense Refill   albuterol (VENTOLIN HFA) 108 (90 Base) MCG/ACT inhaler Inhale 2 puffs into the lungs every 6 (six) hours as needed for wheezing or shortness of breath. (Patient not taking: Reported on 04/20/2021) 6.7 g 0   scopolamine (TRANSDERM-SCOP, 1.5 MG,) 1 MG/3DAYS Place 1 patch (1.5 mg total) onto the skin every 3 (three) days. (Patient not taking: Reported on 04/20/2021) 5 patch 0   No facility-administered medications prior to visit.    Allergies  Allergen Reactions   Ampicillin     rash   Erythromycin     rash    Review of Systems  Constitutional:        (-)Weight change  HENT:  Negative for hearing loss.        (-)rhinorrhea   Eyes:        (-)visual disturbance  Respiratory:  Negative for cough.   Cardiovascular:  Negative for chest pain and leg swelling.  Gastrointestinal:  Negative for blood in stool, constipation, diarrhea, nausea and vomiting.  Genitourinary:  Negative for dysuria and frequency.  Musculoskeletal:  Negative for joint pain and myalgias.  Neurological:  Negative for headaches.  Psychiatric/Behavioral:  Negative for depression. The patient is not nervous/anxious.       Objective:    Physical Exam Constitutional:      General: She is not in acute distress.    Appearance: Normal appearance. She is not ill-appearing.  HENT:     Head: Normocephalic and atraumatic.     Right Ear: Tympanic membrane, ear canal and external ear normal.     Left Ear: Tympanic membrane, ear canal and external ear normal.  Eyes:     Extraocular Movements: Extraocular movements intact.     Pupils: Pupils are equal, round, and reactive to light.     Comments: No nystagmus  Cardiovascular:     Rate and Rhythm: Normal rate and regular rhythm.     Pulses: Normal pulses.     Heart sounds: Normal heart sounds. No murmur heard.   No gallop.  Pulmonary:     Effort: Pulmonary effort is normal. No  respiratory distress.     Breath sounds: Normal breath sounds. No wheezing, rhonchi or rales.  Abdominal:     General: Bowel sounds are normal. There is no distension.     Palpations: Abdomen is soft. There is no mass.     Tenderness: There is no abdominal tenderness. There is no guarding or rebound.     Hernia: No hernia is present.  Musculoskeletal:     Comments: 5/5 strength in both upper and lower extremities   Skin:    General: Skin is warm and  dry.  Neurological:     Mental Status: She is alert and oriented to person, place, and time.     Deep Tendon Reflexes:     Reflex Scores:      Patellar reflexes are 2+ on the right side and 2+ on the left side. Psychiatric:        Behavior: Behavior normal.    BP 118/78 (BP Location: Left Arm, Patient Position: Sitting, Cuff Size: Normal)   Pulse 66   Temp 99.1 F (37.3 C) (Oral)   Resp 18   Ht _0  (1.727 m)   Wt 147 lb 6.4 oz (66.9 kg)   SpO2 97%   BMI 22.41 kg/m  Wt Readings from Last 3 Encounters:  04/20/21 147 lb 6.4 oz (66.9 kg)  10/17/19 152 lb (68.9 kg)  10/28/18 157 lb (71.2 kg)       Assessment & Plan:   Problem List Items Addressed This Visit       Unprioritized   Preventative health care - Primary    Encouraged pt to continue healthy diet and regular exercise.  Recommended that she obtain covid booster #2 at our pharmacy on the first floor. Mammo up to date. Pap up to date per GYN.  Declines shingrix.         Anemia   Relevant Orders   CBC with Differential/Platelet   Other Visit Diagnoses     Hyperlipidemia, unspecified hyperlipidemia type       Relevant Orders   Comp Met (CMET)   Lipid panel        No orders of the defined types were placed in this encounter.   I, Debbrah Alar NP, personally preformed the services described in this documentation.  All medical record entries made by the scribe were at my direction and in my presence.  I have reviewed the chart and discharge instructions  (if applicable) and agree that the record reflects my personal performance and is accurate and complete. 04/20/2021   I,Shehryar Baig,acting as a Education administrator for Nance Pear, NP.,have documented all relevant documentation on the behalf of Nance Pear, NP,as directed by  Nance Pear, NP while in the presence of Nance Pear, NP.   Nance Pear, NP

## 2021-04-20 NOTE — Telephone Encounter (Signed)
Please contact GYN Fredderick Severance) and request copy of pap results.

## 2021-04-22 ENCOUNTER — Other Ambulatory Visit (HOSPITAL_BASED_OUTPATIENT_CLINIC_OR_DEPARTMENT_OTHER): Payer: Self-pay

## 2021-04-22 MED ORDER — COVID-19 MRNA VAC-TRIS(PFIZER) 30 MCG/0.3ML IM SUSP
INTRAMUSCULAR | 0 refills | Status: DC
  Filled 2021-04-22: qty 0.3, 1d supply, fill #0

## 2021-04-29 ENCOUNTER — Encounter: Payer: Self-pay | Admitting: Family

## 2021-11-03 DIAGNOSIS — Z1239 Encounter for other screening for malignant neoplasm of breast: Secondary | ICD-10-CM | POA: Diagnosis not present

## 2021-11-03 DIAGNOSIS — Z1231 Encounter for screening mammogram for malignant neoplasm of breast: Secondary | ICD-10-CM | POA: Diagnosis not present

## 2022-01-10 ENCOUNTER — Other Ambulatory Visit: Payer: Self-pay | Admitting: Family Medicine

## 2022-01-10 ENCOUNTER — Encounter: Payer: Self-pay | Admitting: Family

## 2022-01-10 MED ORDER — SCOPOLAMINE 1 MG/3DAYS TD PT72: 1.0000 | MEDICATED_PATCH | TRANSDERMAL | 0 refills | Status: DC

## 2022-02-21 ENCOUNTER — Ambulatory Visit (INDEPENDENT_AMBULATORY_CARE_PROVIDER_SITE_OTHER): Payer: BC Managed Care – PPO | Admitting: Family

## 2022-02-21 ENCOUNTER — Other Ambulatory Visit (HOSPITAL_COMMUNITY)
Admission: RE | Admit: 2022-02-21 | Discharge: 2022-02-21 | Disposition: A | Discharge status: Home / Self Care | Payer: BC Managed Care – PPO | Source: Ambulatory Visit | Attending: Family | Admitting: Family

## 2022-02-21 ENCOUNTER — Encounter: Payer: Self-pay | Admitting: Family

## 2022-02-21 VITALS — BP 117/59 | HR 61 | Temp 98.2°F | Resp 16 | Ht 68.0 in | Wt 155.0 lb

## 2022-02-21 DIAGNOSIS — N760 Acute vaginitis: Secondary | ICD-10-CM | POA: Diagnosis not present

## 2022-02-21 DIAGNOSIS — K5900 Constipation, unspecified: Secondary | ICD-10-CM

## 2022-02-21 DIAGNOSIS — R5383 Other fatigue: Secondary | ICD-10-CM

## 2022-02-21 LAB — COMPREHENSIVE METABOLIC PANEL
ALT: 18 U/L (ref 0–35)
AST: 20 U/L (ref 0–37)
Albumin: 4.1 g/dL (ref 3.5–5.2)
Alkaline Phosphatase: 78 U/L (ref 39–117)
BUN: 14 mg/dL (ref 6–23)
CO2: 29 mEq/L (ref 19–32)
Calcium: 9 mg/dL (ref 8.4–10.5)
Chloride: 105 mEq/L (ref 96–112)
Creatinine, Ser: 0.77 mg/dL (ref 0.40–1.20)
GFR: 86.61 mL/min (ref >60.00)
Glucose, Bld: 103 mg/dL — ABNORMAL HIGH (ref 70–99)
Potassium: 4.6 mEq/L (ref 3.5–5.1)
Sodium: 139 mEq/L (ref 135–145)
Total Bilirubin: 0.4 mg/dL (ref 0.2–1.2)
Total Protein: 6.4 g/dL (ref 6.0–8.3)

## 2022-02-21 LAB — CBC WITH DIFFERENTIAL/PLATELET
Basophils Absolute: 0 10*3/uL (ref 0.0–0.1)
Basophils Relative: 0.8 % (ref 0.0–3.0)
Eosinophils Absolute: 0.2 10*3/uL (ref 0.0–0.7)
Eosinophils Relative: 4.5 % (ref 0.0–5.0)
HCT: 36.6 % (ref 36.0–46.0)
Hemoglobin: 12.2 g/dL (ref 12.0–15.0)
Lymphocytes Relative: 29.4 % (ref 12.0–46.0)
Lymphs Abs: 1.3 10*3/uL (ref 0.7–4.0)
MCHC: 33.2 g/dL (ref 30.0–36.0)
MCV: 81.6 fl (ref 78.0–100.0)
Monocytes Absolute: 0.3 10*3/uL (ref 0.1–1.0)
Monocytes Relative: 7.8 % (ref 3.0–12.0)
Neutro Abs: 2.5 10*3/uL (ref 1.4–7.7)
Neutrophils Relative %: 57.5 % (ref 43.0–77.0)
Platelets: 213 10*3/uL (ref 150.0–400.0)
RBC: 4.48 Mil/uL (ref 3.87–5.11)
RDW: 14 % (ref 11.5–15.5)
WBC: 4.3 10*3/uL (ref 4.0–10.5)

## 2022-02-21 LAB — TSH: TSH: 1.84 u[IU]/mL (ref 0.35–5.50)

## 2022-02-21 NOTE — Progress Notes (Signed)
? ?Subjective:  ? ?By signing my name below, I, Carylon Perches, attest that this documentation has been prepared under the direction and in the presence of Debbrah Alar NP, 02/21/2022 ? ? Patient ID: Karla Jackson, female    DOB: [birth year], 56 y.o.   MRN: [id number] ? ?Chief Complaint  ?Patient presents with  ? Vaginal Discharge  ?  Complains of "some vaginal discharge"  ? Bloated  ?  Complains of feeling bloated, some abdominal pain  ? ? ?HPI ?Patient is in today for an office visit. ? ?Constipation/Bloating/Nausea - She reports she had symptoms such as constipation, bloating, and nausea. She states that she was on a slim solutions diet that she began a year ago. She states that her weight reached the low 140's. She takes miralax to help relieve her symptoms. She went back to her regular diet two weeks ago and reports that symptoms are a lot more regular. She reports not consuming much dairy. She has set an appointment in the future for with [facility] for food sensitivity testing.  ? ?Vaginal Discharge - She complains of vaginal discharge 2 weeks ago. She also had associated symptoms of an abnormal sensation in her throat. She reports that she had itching in the area about two weeks ago. She did not take OTC medications. As of today's visit, she reports mild discharge.  ? ?Swollen Fingers - She reports that when she wakes up, her joints in her fingers are swollen. ? ? ? ?Health Maintenance Due  ?Topic Date Due  ? Zoster Vaccines- Shingrix (1 of 2) Never done  ? PAP SMEAR-Modifier  09/12/2020  ? COVID-19 Vaccine (5 - Booster for Pfizer series) 06/15/2021  ? ? ?Past Medical History:  ?Diagnosis Date  ? Anemia 08/2016  ? ? ?Past Surgical History:  ?Procedure Laterality Date  ? [place]  ? 1 time  ? ? ?Family History  ?Problem Relation Age of Onset  ? Alzheimer's disease Mother   ?     died 2023/05/04  ? Arthritis Mother   ? Scoliosis Mother   ? Heart disease Father   ?     Valve  replacement surgery, died at 63  ? Hypertension Father   ? Stroke Maternal Grandmother   ? Scoliosis Sister   ? Colon cancer Neg Hx   ? Esophageal cancer Neg Hx   ? Stomach cancer Neg Hx   ? ? ?Social History  ? ?Socioeconomic History  ? Marital status: Married  ?  Spouse name: Not on file  ? Number of children: Not on file  ? Years of education: Not on file  ? Highest education level: Not on file  ?Occupational History  ? Not on file  ?Tobacco Use  ? Smoking status: Never  ? Smokeless tobacco: Never  ?Substance and Sexual Activity  ? Alcohol use: No  ? Drug use: No  ? Sexual activity: Not on file  ?Other Topics Concern  ? Not on file  ?Social History Narrative  ? 3 sons- 72 and 15 (twins)   ? Married  ? 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 Enjoys yoga, walking the dog, listening to books on audible, mother has alzheimers   ? Enjoys outdoor activities   ? ?Social Determinants of Health  ? ?Financial Resource Strain: Not on file  ?Food Insecurity: Not on file  ?Transportation Needs: Not on file  ?Physical Activity: Not on file  ?Stress: Not on file  ?Social  Connections: Not on file  ?Intimate Partner Violence: Not on file  ? ? ?Outpatient Medications Prior to Visit  ?Medication Sig Dispense Refill  ? Multiple Vitamin (MULTIVITAMIN) tablet Take 1 tablet by mouth daily.    ? COVID-19 mRNA Vac-TriS, Pfizer, SUSP injection Inject into the muscle. 0.3 mL 0  ? scopolamine (TRANSDERM-SCOP, 1.5 MG,) 1 MG/3DAYS Place 1 patch (1.5 mg total) onto the skin every 3 (three) days. 5 patch 0  ? ?No facility-administered medications prior to visit.  ? ? ?Allergies  ?Allergen Reactions  ? Ampicillin   ?  rash  ? Erythromycin   ?  rash  ? ? ?Review of Systems  ?Gastrointestinal:  Positive for constipation and nausea.  ?Genitourinary:   ?     (+) Vaginal Discharge  ?Musculoskeletal:  Positive for joint pain (Fingers).  ? ?   ?Objective:  ?  ?Physical Exam ?Constitutional:   ?   General: She is not in  acute distress. ?   Appearance: Normal appearance. She is not ill-appearing.  ?HENT:  ?   Head: Normocephalic and atraumatic.  ?   Right Ear: External ear normal.  ?   Left Ear: External ear normal.  ?Eyes:  ?   Extraocular Movements: Extraocular movements intact.  ?   Pupils: Pupils are equal, round, and reactive to light.  ?Cardiovascular:  ?   Rate and Rhythm: Normal rate and regular rhythm.  ?   Heart sounds: Normal heart sounds. No murmur heard. ?  No gallop.  ?Pulmonary:  ?   Effort: Pulmonary effort is normal. No respiratory distress.  ?   Breath sounds: Normal breath sounds. No wheezing or rales.  ?Abdominal:  ?   General: There is no distension.  ?   Palpations: Abdomen is soft.  ?   Tenderness: There is no abdominal tenderness. There is no guarding.  ?Genitourinary: ?   Exam position: Lithotomy position.  ?   Labia:     ?   Right: No rash.     ?   Left: No rash.   ?   Urethra: No prolapse.  ?Musculoskeletal:  ?   Comments: No joint swelling noted  ?Skin: ?   General: Skin is warm and dry.  ?Neurological:  ?   Mental Status: She is alert and oriented to person, place, and time.  ?Psychiatric:     ?   Mood and Affect: Mood normal.     ?   Behavior: Behavior normal.     ?   Judgment: Judgment normal.  ? ? ?BP (!) 117/59 (BP Location: Left Arm, Patient Position: Sitting, Cuff Size: Small)   Pulse 61   Temp 98.2 ?F (36.8 ?C) (Oral)   Resp 16   Ht _0  (1.727 m)   Wt 155 lb (70.3 kg)   SpO2 99%   BMI 23.57 kg/m?  ?Wt Readings from Last 3 Encounters:  ?02/21/22 155 lb (70.3 kg)  ?04/20/21 147 lb 6.4 oz (66.9 kg)  ?10/17/19 152 lb (68.9 kg)  ? ? ?   ?Assessment & Plan:  ? ?Problem List Items Addressed This Visit   ? ?  ? Unprioritized  ? Constipation  ?  Was worse when she was taking a protein diet supplement. She is now back on a regular diet. Could be an element of IBS. She plans to go to [place] to have food sensitivity testing.  We discussed possibility of GI referral if symptoms  worsen. In the meantime, recommended that  she eliminate dairy and gluten from her diet.  Then slowly add back one at a time to see how she does from a GI standpoint.  ? ?  ?  ? Relevant Orders  ? CBC with Differential/Platelet  ? Comp Met (CMET)  ? TSH  ? Acute vaginitis  ?  Swab obtained and will be sent for yeast/BV testing.  ? ?  ?  ? Relevant Orders  ? Cervicovaginal ancillary only( [place])  ? ?Other Visit Diagnoses   ? ? Fatigue, unspecified type    -  Primary  ? Relevant Orders  ? CBC with Differential/Platelet  ? Comp Met (CMET)  ? TSH  ? ?  ? ? ?No orders of the defined types were placed in this encounter. ? ? ?I, Nance Pear, NP, personally preformed the services described in this documentation.  All medical record entries made by the scribe were at my direction and in my presence.  I have reviewed the chart and discharge instructions (if applicable) and agree that the record reflects my personal performance and is accurate and complete. 02/21/2022 ? ? ?I,Amber Collins,acting as a Education administrator for [employer], NP.,have documented all relevant documentation on the behalf of Nance Pear, NP,as directed by  Nance Pear, NP while in the presence of Nance Pear, NP. ? ? ? ? ? ?Nance Pear, NP ? ?

## 2022-02-21 NOTE — Patient Instructions (Signed)
Try cutting out dairy and gluten to see how you feel, then you can slowly add one back at a time.  ?Call if symptoms worsen or if they do now improve.  ?

## 2022-02-21 NOTE — Assessment & Plan Note (Signed)
Was worse when she was taking a protein diet supplement. She is now back on a regular diet. Could be an element of IBS. She plans to go to [facility] to have food sensitivity testing.  We discussed possibility of GI referral if symptoms worsen. In the meantime, recommended that she eliminate dairy and gluten from her diet.  Then slowly add back one at a time to see how she does from a GI standpoint.  ?

## 2022-02-21 NOTE — Assessment & Plan Note (Signed)
Swab obtained and will be sent for yeast/BV testing.  ?

## 2022-02-23 LAB — CERVICOVAGINAL ANCILLARY ONLY
Bacterial Vaginitis (gardnerella): NEGATIVE
Candida Glabrata: NEGATIVE
Candida Vaginitis: NEGATIVE
Comment: NEGATIVE
Comment: NEGATIVE
Comment: NEGATIVE

## 2022-03-20 DIAGNOSIS — R7301 Impaired fasting glucose: Secondary | ICD-10-CM | POA: Diagnosis not present

## 2022-03-20 DIAGNOSIS — E7800 Pure hypercholesterolemia, unspecified: Secondary | ICD-10-CM | POA: Diagnosis not present

## 2022-03-20 DIAGNOSIS — E559 Vitamin D deficiency, unspecified: Secondary | ICD-10-CM | POA: Diagnosis not present

## 2022-03-20 DIAGNOSIS — Z820 Family history of epilepsy and other diseases of the nervous system: Secondary | ICD-10-CM | POA: Diagnosis not present

## 2022-03-20 DIAGNOSIS — R5383 Other fatigue: Secondary | ICD-10-CM | POA: Diagnosis not present

## 2022-03-20 DIAGNOSIS — E782 Mixed hyperlipidemia: Secondary | ICD-10-CM | POA: Diagnosis not present

## 2022-03-20 DIAGNOSIS — Z1329 Encounter for screening for other suspected endocrine disorder: Secondary | ICD-10-CM | POA: Diagnosis not present

## 2022-03-20 DIAGNOSIS — R519 Headache, unspecified: Secondary | ICD-10-CM | POA: Diagnosis not present

## 2022-03-20 DIAGNOSIS — N951 Menopausal and female climacteric states: Secondary | ICD-10-CM | POA: Diagnosis not present

## 2022-04-20 DIAGNOSIS — G4700 Insomnia, unspecified: Secondary | ICD-10-CM | POA: Diagnosis not present

## 2022-04-20 DIAGNOSIS — E559 Vitamin D deficiency, unspecified: Secondary | ICD-10-CM | POA: Diagnosis not present

## 2022-04-20 DIAGNOSIS — Z1589 Genetic susceptibility to other disease: Secondary | ICD-10-CM | POA: Diagnosis not present

## 2022-04-20 DIAGNOSIS — N951 Menopausal and female climacteric states: Secondary | ICD-10-CM | POA: Diagnosis not present

## 2022-07-05 DIAGNOSIS — N951 Menopausal and female climacteric states: Secondary | ICD-10-CM | POA: Diagnosis not present

## 2022-07-05 DIAGNOSIS — D8989 Other specified disorders involving the immune mechanism, not elsewhere classified: Secondary | ICD-10-CM | POA: Diagnosis not present

## 2022-07-20 DIAGNOSIS — B2790 Infectious mononucleosis, unspecified without complication: Secondary | ICD-10-CM | POA: Diagnosis not present

## 2022-07-20 DIAGNOSIS — N951 Menopausal and female climacteric states: Secondary | ICD-10-CM | POA: Diagnosis not present

## 2022-08-11 ENCOUNTER — Other Ambulatory Visit (HOSPITAL_COMMUNITY): Payer: Self-pay

## 2024-01-02 ENCOUNTER — Encounter: Payer: Self-pay | Admitting: Family

## 2024-01-07 ENCOUNTER — Other Ambulatory Visit: Payer: Self-pay

## 2024-01-08 ENCOUNTER — Encounter: Payer: Self-pay | Admitting: Family

## 2024-01-08 ENCOUNTER — Ambulatory Visit (INDEPENDENT_AMBULATORY_CARE_PROVIDER_SITE_OTHER): Payer: Commercial Managed Care - PPO | Admitting: Family

## 2024-01-08 VITALS — BP 105/58 | HR 60 | Temp 97.9°F | Resp 16 | Ht 68.0 in | Wt 155.0 lb

## 2024-01-08 DIAGNOSIS — R739 Hyperglycemia, unspecified: Secondary | ICD-10-CM | POA: Diagnosis not present

## 2024-01-08 DIAGNOSIS — Z0000 Encounter for general adult medical examination without abnormal findings: Secondary | ICD-10-CM

## 2024-01-08 LAB — COMPREHENSIVE METABOLIC PANEL WITH GFR
ALT: 13 U/L (ref 0–35)
AST: 18 U/L (ref 0–37)
Albumin: 4.2 g/dL (ref 3.5–5.2)
Alkaline Phosphatase: 58 U/L (ref 39–117)
BUN: 26 mg/dL — ABNORMAL HIGH (ref 6–23)
CO2: 30 meq/L (ref 19–32)
Calcium: 9.4 mg/dL (ref 8.4–10.5)
Chloride: 105 meq/L (ref 96–112)
Creatinine, Ser: 0.85 mg/dL (ref 0.40–1.20)
GFR: 75.91 mL/min (ref >60.00)
Glucose, Bld: 87 mg/dL (ref 70–99)
Potassium: 4.9 meq/L (ref 3.5–5.1)
Sodium: 140 meq/L (ref 135–145)
Total Bilirubin: 0.3 mg/dL (ref 0.2–1.2)
Total Protein: 6.6 g/dL (ref 6.0–8.3)

## 2024-01-08 LAB — LIPID PANEL
Cholesterol: 181 mg/dL (ref 0–200)
HDL: 68.5 mg/dL (ref >39.00)
LDL Cholesterol: 86 mg/dL (ref 0–99)
NonHDL: 112.04
Total CHOL/HDL Ratio: 3
Triglycerides: 129 mg/dL (ref 0.0–149.0)
VLDL: 25.8 mg/dL (ref 0.0–40.0)

## 2024-01-08 LAB — HEMOGLOBIN A1C: Hgb A1c MFr Bld: 5.8 % (ref 4.6–6.5)

## 2024-01-08 NOTE — Progress Notes (Signed)
 Established Patient Office Visit  Subjective   Patient ID: Karla Jackson, female    DOB: 1966-04-27  Age: 58 y.o. MRN: [id number]  Chief Complaint  Patient presents with   Annual Exam    58 yo patient presents for annual physical exam. She states she feels well. Reports that she is having intermittent lower back pain worse with heavy lifting. Patient states pain improves with stretching and occasional ibuprofen.   Dental: routine exams twice yearly. Last one in 10/2023. Brushes twice daily. Flosses occasionally. No dental concerns. No removable dental hardware. Eye: 10/2023. Wears contacts and readers. Gyn: Sees gyn regularly (11/2023). Paps when due. Mammogram: 09/2023 Immunizations: patient declines Shingrix Seat belt: front and back seat at all times Tobacco/vaping: denies Alcohol: denies Drug: denies Socioeconomic - owns her home; husband lives in home with her; adequate financial resources    Review of Systems  Constitutional:  Negative for chills and fever.  HENT:  Negative for hearing loss and nosebleeds.   Eyes:  Negative for blurred vision and double vision.  Respiratory:  Negative for cough and shortness of breath.   Cardiovascular:  Negative for chest pain, palpitations and leg swelling.  Gastrointestinal:  Negative for abdominal pain, heartburn, nausea and vomiting.  Genitourinary:  Negative for dysuria, frequency and urgency.  Musculoskeletal:  Negative for joint pain and myalgias.  Skin:  Negative for rash.  Neurological:  Negative for dizziness and headaches.  Endo/Heme/Allergies:  Does not bruise/bleed easily.  Psychiatric/Behavioral:  Negative for depression.       Objective:     BP (!) 105/58 (BP Location: Right Arm, Patient Position: Sitting, Cuff Size: Small)   Pulse 60   Temp 97.9 F (36.6 C) (Oral)   Resp 16   Ht 5\' 8"  (1.727 m)   Wt 155 lb (70.3 kg)   SpO2 100%   BMI 23.57 kg/m    Physical Exam Vitals reviewed.  Constitutional:       Appearance: Normal appearance.  HENT:     Head: Normocephalic and atraumatic.     Right Ear: Tympanic membrane normal.     Left Ear: Tympanic membrane normal.     Nose: No congestion or rhinorrhea.     Mouth/Throat:     Mouth: Mucous membranes are moist.     Pharynx: Oropharynx is clear.  Eyes:     Extraocular Movements: Extraocular movements intact.     Conjunctiva/sclera: Conjunctivae normal.     Pupils: Pupils are equal, round, and reactive to light.  Cardiovascular:     Rate and Rhythm: Normal rate and regular rhythm.     Pulses: Normal pulses.     Heart sounds: Normal heart sounds.  Pulmonary:     Effort: Pulmonary effort is normal.     Breath sounds: Normal breath sounds.  Abdominal:     General: Bowel sounds are normal.     Palpations: Abdomen is soft.  Musculoskeletal:     Cervical back: Neck supple.  Lymphadenopathy:     Cervical: No cervical adenopathy.  Skin:    General: Skin is warm and dry.     Capillary Refill: Capillary refill takes less than 2 seconds.  Neurological:     Mental Status: She is alert and oriented to person, place, and time.  Psychiatric:        Mood and Affect: Mood normal.        Behavior: Behavior normal.      Assessment & Plan:   -Back pain - reports pain  worse with lifting. Relief achieved with stretching and ibuprofen. Will call office with worsening signs/symptoms.    Cristopher Peru, RN

## 2024-01-08 NOTE — Progress Notes (Signed)
 Subjective:     Patient ID: Karla Jackson, female    DOB: 1966/09/26, 58 y.o.   MRN: [id number]  Chief Complaint  Patient presents with   Annual Exam    HPI  Discussed the use of AI scribe software for clinical note transcription with the patient, who gave verbal consent to proceed.  History of Present Illness Karla Jackson "Karla Jackson" is a 58 year old female who presents for her annual physical exam.  She has no current complaints and maintains a healthy diet and active lifestyle. She regularly visits her gynecologist, with her last Pap smear in February and her last mammogram on December 24th. She is due for a Shingrix vaccine. She declines the flu vaccine and has consistently declined HIV and hepatitis C screenings. No recent bruising, bleeding, cough, cold symptoms, digestive concerns, urinary issues, depression, anxiety, headaches, or skin rashes. She is eating more protein and takes measures to maintain regular bowel movements.  She has a history of mild hyperglycemia with blood glucose levels ranging from 103 to 112 mg/dL. Her A1c was 6.0 in the past but decreased to 5.5 in October.  She is monitoring her cholesterol, which was 215 mg/dL in the fall, up from 518 mg/dL in April.  She experiences acute on chronic low back pain, which she manages with stretching and ibuprofen. There is a family history of osteoporosis. Her back pain typically resolves with her current management strategy.  She takes estradiol and progesterone, and occasionally testosterone, which is compounded and obtained from [facility]. She reports no issues with libido.      Health Maintenance Due  Topic Date Due   Zoster Vaccines- Shingrix (1 of 2) Never done   Cervical Cancer Screening (HPV/Pap Cotest)  09/12/2022   MAMMOGRAM  10/19/2022   COVID-19 Vaccine (5 - 2024-25 season) 06/10/2023    Past Medical History:  Diagnosis Date   Anemia 08/2016    Past Surgical History:  Procedure Laterality  Date   CESAREAN SECTION  1998   1 time    Family History  Problem Relation Age of Onset   Alzheimer's disease Mother        died 04/29/24   Arthritis Mother    Scoliosis Mother    Heart disease Father        Valve replacement surgery, died at 87   Hypertension Father    Stroke Maternal Grandmother    Scoliosis Sister    Colon cancer Neg Hx    Esophageal cancer Neg Hx    Stomach cancer Neg Hx     Social History   Socioeconomic History   Marital status: Married    Spouse name: Not on file   Number of children: Not on file   Years of education: Not on file   Highest education level: Bachelor's degree (e.g., BA, AB, BS)  Occupational History   Not on file  Tobacco Use   Smoking status: Never   Smokeless tobacco: Never  Substance and Sexual Activity   Alcohol use: No   Drug use: No   Sexual activity: Not on file  Other Topics Concern   Not on file  Social History Narrative   3 sons- 47 and 59 (twins)    Married   Works as an Environmental health practitioner- [employer]   Enjoys yoga, walking the dog, listening to books on audible, mother has Archivist    Enjoys outdoor activities    Social Drivers of Corporate investment banker Strain: Low  Risk  (01/07/2024)   Overall Financial Resource Strain (CARDIA)    Difficulty of Paying Living Expenses: Not very hard  Food Insecurity: No Food Insecurity (01/07/2024)   Hunger Vital Sign    Worried About Running Out of Food in the Last Year: Never true    Ran Out of Food in the Last Year: Never true  Transportation Needs: No Transportation Needs (01/07/2024)   PRAPARE - Administrator, Civil Service (Medical): No    Lack of Transportation (Non-Medical): No  Physical Activity: Sufficiently Active (01/07/2024)   Exercise Vital Sign    Days of Exercise per Week: 7 days    Minutes of Exercise per Session: 30 min  Stress: No Stress Concern Present (01/07/2024)   [employer] of Occupational Health -  Occupational Stress Questionnaire    Feeling of Stress : Not at all  Social Connections: Unknown (01/07/2024)   Social Connection and Isolation Panel [NHANES]    Frequency of Communication with Friends and Family: Twice a week    Frequency of Social Gatherings with Friends and Family: Patient declined    Attends Religious Services: Patient declined    Database administrator or Organizations: No    Attends Engineer, structural: Not on file    Marital Status: Married  Catering manager Violence: Not on file    Outpatient Medications Prior to Visit  Medication Sig Dispense Refill   Coenzyme Q10 (COQ10 PO) Take by mouth.     estradiol (ESTRACE) 1 MG tablet Take 1 mg by mouth daily.     Lactobacillus (PROBIOTIC ACIDOPHILUS PO) Take by mouth.     PRASTERONE, DHEA, PO Take by mouth.     progesterone (PROMETRIUM) 100 MG capsule Take 100 mg by mouth daily.     VITAMIN D, CHOLECALCIFEROL, PO Take by mouth.     Multiple Vitamin (MULTIVITAMIN) tablet Take 1 tablet by mouth daily.     No facility-administered medications prior to visit.    Allergies  Allergen Reactions   Ampicillin     rash   Erythromycin     rash    ROS     Objective:    Physical Exam   BP (!) 105/58 (BP Location: Right Arm, Patient Position: Sitting, Cuff Size: Small)   Pulse 60   Temp 97.9 F (36.6 C) (Oral)   Resp 16   Ht 5\' 8"  (1.727 m)   Wt 155 lb (70.3 kg)   SpO2 100%   BMI 23.57 kg/m  Wt Readings from Last 3 Encounters:  01/08/24 155 lb (70.3 kg)  02/21/22 155 lb (70.3 kg)  04/20/21 147 lb 6.4 oz (66.9 kg)       Assessment & Plan:   Problem List Items Addressed This Visit       Unprioritized   Preventative health care   58 year old female presents for her annual physical examination. She maintains a healthy diet and active lifestyle. Regular gynecological visits are up to date, with the last Pap smear in February and a mammogram on October 02, 2023. She has mild hyperglycemia and  elevated cholesterol levels. She declines Shingrix and flu vaccines, as well as HIV and hepatitis C screenings. She is considering the shingles vaccine at age 34. - Perform lipid panel - Perform A1c test - Perform comprehensive metabolic panel (CMP) - Discuss the benefits of the Shingrix vaccine  - Continue healthy diet and exercise routine      Relevant Orders   Lipid panel   Other  Visit Diagnoses       Hyperglycemia    -  Primary   Relevant Orders   HgB A1c   Comp Met (CMET)       I am having Karla Jackson "Karla Jackson" maintain her multivitamin, (VITAMIN D, CHOLECALCIFEROL, PO), (PRASTERONE, DHEA, PO), progesterone, Lactobacillus (PROBIOTIC ACIDOPHILUS PO), estradiol, and Coenzyme Q10 (COQ10 PO).  No orders of the defined types were placed in this encounter.

## 2024-01-08 NOTE — Patient Instructions (Signed)
 VISIT SUMMARY:  You had your annual physical exam today. You reported no current complaints and maintain a healthy diet and active lifestyle. Your gynecological visits are up to date, and you are due for a Shingrix vaccine. You have mild hyperglycemia and elevated cholesterol levels. You experience low back pain, which you manage with stretching and ibuprofen.  YOUR PLAN:  -ANNUAL PHYSICAL EXAMINATION: You are in good health and maintain a healthy lifestyle. We will perform a lipid panel, A1c test, and comprehensive metabolic panel (CMP) to monitor your health. We discussed the benefits of the Shingrix vaccine, and you are considering it at age 58. Continue with your healthy diet and exercise routine.  -HYPERGLYCEMIA: Hyperglycemia means you have higher than normal blood sugar levels. We will perform an A1c test to further evaluate your blood sugar control.  -HYPERLIPIDEMIA: Hyperlipidemia means you have elevated cholesterol levels. We will perform a lipid panel to monitor your cholesterol levels.  INSTRUCTIONS:  Please follow up for your lab tests, including the lipid panel, A1c test, and comprehensive metabolic panel (CMP). Consider the Shingrix vaccine at age 30. If your low back pain becomes more frequent, we may need to consider physical therapy.

## 2024-01-08 NOTE — Assessment & Plan Note (Addendum)
 58 year old female presents for her annual physical examination. She maintains a healthy diet and active lifestyle. Regular gynecological visits are up to date, with the last Pap smear in February and a mammogram on October 02, 2023. She has mild hyperglycemia and elevated cholesterol levels. She declines Shingrix and flu vaccines, as well as HIV and hepatitis C screenings. She is considering the shingles vaccine at age 85. - Perform lipid panel - Perform A1c test - Perform comprehensive metabolic panel (CMP) - Discuss the benefits of the Shingrix vaccine  - Continue healthy diet and exercise routine

## 2025-01-13 ENCOUNTER — Encounter: Admitting: Family
